# Patient Record
Sex: Male | Born: 1955 | Race: White | Hispanic: No | Smoking: Never smoker
Health system: Southern US, Community
[De-identification: ages and names within clinical notes are randomized; demographics above are authoritative.]

## PROBLEM LIST (undated history)

## (undated) DIAGNOSIS — G4730 Sleep apnea, unspecified: Secondary | ICD-10-CM

## (undated) DIAGNOSIS — J309 Allergic rhinitis, unspecified: Secondary | ICD-10-CM

## (undated) DIAGNOSIS — F32A Depression, unspecified: Secondary | ICD-10-CM

## (undated) DIAGNOSIS — E291 Testicular hypofunction: Secondary | ICD-10-CM

## (undated) DIAGNOSIS — F329 Major depressive disorder, single episode, unspecified: Secondary | ICD-10-CM

## (undated) DIAGNOSIS — I10 Essential (primary) hypertension: Secondary | ICD-10-CM

## (undated) DIAGNOSIS — E785 Hyperlipidemia, unspecified: Secondary | ICD-10-CM

## (undated) DIAGNOSIS — T7840XA Allergy, unspecified, initial encounter: Secondary | ICD-10-CM

## (undated) HISTORY — DX: Major depressive disorder, single episode, unspecified: F32.9

## (undated) HISTORY — DX: Sleep apnea, unspecified: G47.30

## (undated) HISTORY — DX: Allergic rhinitis, unspecified: J30.9

## (undated) HISTORY — DX: Depression, unspecified: F32.A

## (undated) HISTORY — DX: Hyperlipidemia, unspecified: E78.5

## (undated) HISTORY — DX: Allergy, unspecified, initial encounter: T78.40XA

## (undated) HISTORY — DX: Essential (primary) hypertension: I10

## (undated) HISTORY — DX: Testicular hypofunction: E29.1

---
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
## 1983-10-24 HISTORY — PX: KNEE ARTHROSCOPY W/ ACL RECONSTRUCTION AND HAMSTRING GRAFT: SHX1860

## 1997-11-19 ENCOUNTER — Encounter: Payer: Self-pay | Admitting: Pulmonary Disease

## 2006-10-23 LAB — HM COLONOSCOPY: HM Colonoscopy: NORMAL

## 2008-05-11 ENCOUNTER — Encounter: Payer: Self-pay | Admitting: Family Medicine

## 2008-09-24 ENCOUNTER — Ambulatory Visit: Payer: Self-pay | Admitting: Pulmonary Disease

## 2008-09-24 DIAGNOSIS — J309 Allergic rhinitis, unspecified: Secondary | ICD-10-CM | POA: Insufficient documentation

## 2008-09-24 DIAGNOSIS — E785 Hyperlipidemia, unspecified: Secondary | ICD-10-CM | POA: Insufficient documentation

## 2008-09-24 DIAGNOSIS — G4730 Sleep apnea, unspecified: Secondary | ICD-10-CM | POA: Insufficient documentation

## 2008-09-24 DIAGNOSIS — I10 Essential (primary) hypertension: Secondary | ICD-10-CM | POA: Insufficient documentation

## 2008-10-04 ENCOUNTER — Encounter: Payer: Self-pay | Admitting: Pulmonary Disease

## 2008-10-29 ENCOUNTER — Ambulatory Visit (HOSPITAL_BASED_OUTPATIENT_CLINIC_OR_DEPARTMENT_OTHER): Admission: RE | Admit: 2008-10-29 | Discharge: 2008-10-29 | Payer: Self-pay | Admitting: Pulmonary Disease

## 2008-10-29 ENCOUNTER — Ambulatory Visit: Payer: Self-pay | Admitting: Family Medicine

## 2008-10-29 ENCOUNTER — Encounter: Payer: Self-pay | Admitting: Pulmonary Disease

## 2008-10-29 DIAGNOSIS — E119 Type 2 diabetes mellitus without complications: Secondary | ICD-10-CM | POA: Insufficient documentation

## 2008-10-29 LAB — CONVERTED CEMR LAB: Varicella IgG: 4.18 — ABNORMAL HIGH

## 2008-11-01 DIAGNOSIS — F331 Major depressive disorder, recurrent, moderate: Secondary | ICD-10-CM | POA: Insufficient documentation

## 2008-11-02 LAB — CONVERTED CEMR LAB
ALT: 83 units/L — ABNORMAL HIGH (ref 0–53)
AST: 50 units/L — ABNORMAL HIGH (ref 0–37)
Albumin: 4.3 g/dL (ref 3.5–5.2)
Alkaline Phosphatase: 58 units/L (ref 39–117)
BUN: 13 mg/dL (ref 6–23)
Basophils Absolute: 0 10*3/uL (ref 0.0–0.1)
Basophils Relative: 0.1 % (ref 0.0–3.0)
Bilirubin, Direct: 0.1 mg/dL (ref 0.0–0.3)
CO2: 30 meq/L (ref 19–32)
Calcium: 9.2 mg/dL (ref 8.4–10.5)
Chloride: 101 meq/L (ref 96–112)
Cholesterol: 132 mg/dL (ref 0–200)
Creatinine, Ser: 1 mg/dL (ref 0.4–1.5)
Creatinine,U: 98.9 mg/dL
Eosinophils Absolute: 0.2 10*3/uL (ref 0.0–0.7)
Eosinophils Relative: 2.7 % (ref 0.0–5.0)
GFR calc Af Amer: 101 mL/min
GFR calc non Af Amer: 83 mL/min
Glucose, Bld: 132 mg/dL — ABNORMAL HIGH (ref 70–99)
HCT: 47.6 % (ref 39.0–52.0)
HDL: 29.6 mg/dL — ABNORMAL LOW (ref >39.0)
Hemoglobin: 16.8 g/dL (ref 13.0–17.0)
Hgb A1c MFr Bld: 6 % (ref 4.6–6.0)
LDL Cholesterol: 80 mg/dL (ref 0–99)
Lymphocytes Relative: 38 % (ref 12.0–46.0)
MCHC: 35.3 g/dL (ref 30.0–36.0)
MCV: 90.5 fL (ref 78.0–100.0)
Microalb Creat Ratio: 15.2 mg/g (ref 0.0–30.0)
Microalb, Ur: 1.5 mg/dL (ref 0.0–1.9)
Monocytes Absolute: 0.5 10*3/uL (ref 0.1–1.0)
Monocytes Relative: 9.1 % (ref 3.0–12.0)
Neutro Abs: 3 10*3/uL (ref 1.4–7.7)
Neutrophils Relative %: 50.1 % (ref 43.0–77.0)
PSA: 0.64 ng/mL (ref 0.10–4.00)
Platelets: 199 10*3/uL (ref 150–400)
Potassium: 3.9 meq/L (ref 3.5–5.1)
RBC: 5.26 M/uL (ref 4.22–5.81)
RDW: 12.1 % (ref 11.5–14.6)
Sodium: 137 meq/L (ref 135–145)
Total Bilirubin: 0.9 mg/dL (ref 0.3–1.2)
Total CHOL/HDL Ratio: 4.5
Total Protein: 7.2 g/dL (ref 6.0–8.3)
Triglycerides: 112 mg/dL (ref 0–149)
VLDL: 22 mg/dL (ref 0–40)
WBC: 5.9 10*3/uL (ref 4.5–10.5)

## 2008-11-10 ENCOUNTER — Ambulatory Visit: Payer: Self-pay | Admitting: Pulmonary Disease

## 2009-02-02 ENCOUNTER — Ambulatory Visit: Payer: Self-pay | Admitting: Family Medicine

## 2009-02-03 LAB — CONVERTED CEMR LAB
ALT: 44 units/L (ref 0–53)
AST: 33 units/L (ref 0–37)
Albumin: 4.2 g/dL (ref 3.5–5.2)
Alkaline Phosphatase: 58 units/L (ref 39–117)
BUN: 13 mg/dL (ref 6–23)
Bilirubin, Direct: 0.1 mg/dL (ref 0.0–0.3)
CO2: 31 meq/L (ref 19–32)
Calcium: 9.2 mg/dL (ref 8.4–10.5)
Chloride: 105 meq/L (ref 96–112)
Creatinine, Ser: 1 mg/dL (ref 0.4–1.5)
GFR calc non Af Amer: 82.82 mL/min (ref >60)
Glucose, Bld: 114 mg/dL — ABNORMAL HIGH (ref 70–99)
Hgb A1c MFr Bld: 5.9 % (ref 4.6–6.5)
Potassium: 4.2 meq/L (ref 3.5–5.1)
Sodium: 141 meq/L (ref 135–145)
Total Bilirubin: 0.9 mg/dL (ref 0.3–1.2)
Total Protein: 7.5 g/dL (ref 6.0–8.3)

## 2009-09-03 ENCOUNTER — Ambulatory Visit: Payer: Self-pay | Admitting: Family Medicine

## 2009-09-03 DIAGNOSIS — Z8639 Personal history of other endocrine, nutritional and metabolic disease: Secondary | ICD-10-CM

## 2009-09-03 DIAGNOSIS — Z862 Personal history of diseases of the blood and blood-forming organs and certain disorders involving the immune mechanism: Secondary | ICD-10-CM | POA: Insufficient documentation

## 2009-09-03 LAB — CONVERTED CEMR LAB
ALT: 73 units/L — ABNORMAL HIGH (ref 0–53)
AST: 49 units/L — ABNORMAL HIGH (ref 0–37)
Albumin: 4.5 g/dL (ref 3.5–5.2)
Alkaline Phosphatase: 46 units/L (ref 39–117)
BUN: 14 mg/dL (ref 6–23)
Bilirubin, Direct: 0.1 mg/dL (ref 0.0–0.3)
CO2: 31 meq/L (ref 19–32)
Calcium: 9.5 mg/dL (ref 8.4–10.5)
Chloride: 102 meq/L (ref 96–112)
Cholesterol: 188 mg/dL (ref 0–200)
Creatinine, Ser: 1 mg/dL (ref 0.4–1.5)
GFR calc non Af Amer: 82.64 mL/min (ref >60)
Glucose, Bld: 123 mg/dL — ABNORMAL HIGH (ref 70–99)
HDL: 38.9 mg/dL — ABNORMAL LOW (ref >39.00)
Hgb A1c MFr Bld: 6.5 % (ref 4.6–6.5)
LDL Cholesterol: 117 mg/dL — ABNORMAL HIGH (ref 0–99)
Potassium: 4.5 meq/L (ref 3.5–5.1)
Sodium: 141 meq/L (ref 135–145)
Total Bilirubin: 0.9 mg/dL (ref 0.3–1.2)
Total CHOL/HDL Ratio: 5
Total Protein: 7.8 g/dL (ref 6.0–8.3)
Triglycerides: 159 mg/dL — ABNORMAL HIGH (ref 0.0–149.0)
VLDL: 31.8 mg/dL (ref 0.0–40.0)

## 2009-09-15 ENCOUNTER — Ambulatory Visit: Payer: Self-pay | Admitting: Family Medicine

## 2009-10-12 ENCOUNTER — Ambulatory Visit: Payer: Self-pay | Admitting: Family Medicine

## 2009-11-16 ENCOUNTER — Encounter: Payer: Self-pay | Admitting: Family Medicine

## 2009-12-30 ENCOUNTER — Telehealth: Payer: Self-pay | Admitting: Family Medicine

## 2010-01-09 ENCOUNTER — Encounter: Payer: Self-pay | Admitting: Family Medicine

## 2010-01-10 ENCOUNTER — Ambulatory Visit: Payer: Self-pay | Admitting: Family Medicine

## 2010-01-12 LAB — CONVERTED CEMR LAB
ALT: 88 units/L — ABNORMAL HIGH (ref 0–53)
AST: 60 units/L — ABNORMAL HIGH (ref 0–37)
Albumin: 4.4 g/dL (ref 3.5–5.2)
Alkaline Phosphatase: 46 units/L (ref 39–117)
BUN: 12 mg/dL (ref 6–23)
Basophils Absolute: 0 10*3/uL (ref 0.0–0.1)
Basophils Relative: 0.3 % (ref 0.0–3.0)
Bilirubin, Direct: 0 mg/dL (ref 0.0–0.3)
CO2: 31 meq/L (ref 19–32)
Calcium: 9.5 mg/dL (ref 8.4–10.5)
Chloride: 103 meq/L (ref 96–112)
Cholesterol: 181 mg/dL (ref 0–200)
Creatinine, Ser: 1.1 mg/dL (ref 0.4–1.5)
Eosinophils Absolute: 0.2 10*3/uL (ref 0.0–0.7)
Eosinophils Relative: 2.5 % (ref 0.0–5.0)
GFR calc non Af Amer: 73.93 mL/min (ref >60)
Glucose, Bld: 113 mg/dL — ABNORMAL HIGH (ref 70–99)
HCT: 48.7 % (ref 39.0–52.0)
HDL: 39.5 mg/dL (ref >39.00)
Hemoglobin: 16.5 g/dL (ref 13.0–17.0)
Hgb A1c MFr Bld: 6.4 % (ref 4.6–6.5)
LDL Cholesterol: 109 mg/dL — ABNORMAL HIGH (ref 0–99)
Lymphocytes Relative: 32.6 % (ref 12.0–46.0)
Lymphs Abs: 2.4 10*3/uL (ref 0.7–4.0)
MCHC: 33.9 g/dL (ref 30.0–36.0)
MCV: 94.6 fL (ref 78.0–100.0)
Monocytes Absolute: 0.7 10*3/uL (ref 0.1–1.0)
Monocytes Relative: 9.3 % (ref 3.0–12.0)
Neutro Abs: 4.1 10*3/uL (ref 1.4–7.7)
Neutrophils Relative %: 55.3 % (ref 43.0–77.0)
Platelets: 155 10*3/uL (ref 150.0–400.0)
Potassium: 4.2 meq/L (ref 3.5–5.1)
RBC: 5.15 M/uL (ref 4.22–5.81)
RDW: 12.4 % (ref 11.5–14.6)
Sodium: 139 meq/L (ref 135–145)
Total Bilirubin: 0.9 mg/dL (ref 0.3–1.2)
Total CHOL/HDL Ratio: 5
Total Protein: 7.8 g/dL (ref 6.0–8.3)
Triglycerides: 165 mg/dL — ABNORMAL HIGH (ref 0.0–149.0)
VLDL: 33 mg/dL (ref 0.0–40.0)
WBC: 7.4 10*3/uL (ref 4.5–10.5)

## 2010-02-20 HISTORY — PX: TOTAL KNEE ARTHROPLASTY: SHX125

## 2010-03-14 ENCOUNTER — Inpatient Hospital Stay (HOSPITAL_COMMUNITY): Admission: RE | Admit: 2010-03-14 | Discharge: 2010-03-17 | Payer: Self-pay | Admitting: Orthopedic Surgery

## 2010-05-27 ENCOUNTER — Telehealth (INDEPENDENT_AMBULATORY_CARE_PROVIDER_SITE_OTHER): Payer: Self-pay | Admitting: *Deleted

## 2010-06-09 ENCOUNTER — Encounter: Payer: Self-pay | Admitting: Family Medicine

## 2010-06-23 ENCOUNTER — Ambulatory Visit: Payer: Self-pay | Admitting: Family Medicine

## 2010-06-23 DIAGNOSIS — R5381 Other malaise: Secondary | ICD-10-CM | POA: Insufficient documentation

## 2010-06-23 DIAGNOSIS — R5383 Other fatigue: Secondary | ICD-10-CM

## 2010-06-24 LAB — CONVERTED CEMR LAB: Hgb A1c MFr Bld: 5.8 % (ref 4.6–6.5)

## 2010-08-04 ENCOUNTER — Ambulatory Visit: Payer: Self-pay | Admitting: Family Medicine

## 2010-11-18 ENCOUNTER — Encounter: Payer: Self-pay | Admitting: Family Medicine

## 2010-11-22 NOTE — Consult Note (Signed)
Summary: [facility]   Imported By: Lanelle Bal 11/25/2009 08:48:48  _____________________________________________________________________  External Attachment:    Type:   Image     Comment:   External Document

## 2010-11-22 NOTE — Assessment & Plan Note (Signed)
Summary: MED REFILL/DLO   Vital Signs:  Patient profile:   55 year old male Height:      71.75 inches Weight:      272.0 pounds BMI:     37.28 Temp:     98.0 degrees F oral Pulse rate:   72 / minute Pulse rhythm:   regular BP sitting:   112 / 80  (left arm) Cuff size:   large  Vitals Entered By: Benny Lennert CMA Duncan Dull) (August 04, 2010 9:11 AM)  History of Present Illness: Chief complaint discuss medication  DM:  fatigue / depression  Still feeling tired during the day Having the some issues not some problems with his wife.  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 in the past.   Lack of sound sleep.    ROS: no fever, chills, sweats. + fatigue, trouble sleeping. eating and drinking ok.  GEN: Well-developed,well-nourished,in no acute distress; alert,appropriate and cooperative throughout examination HEENT: Normocephalic and atraumatic without obvious abnormalities. No apparent alopecia or balding. Ears, externally no deformities PULM: Breathing comfortably in no respiratory distress EXT: No clubbing, cyanosis, or edema PSYCH: Normally interactive. Cooperative during the interview. Pleasant. Friendly and conversant. Not anxious or depressed appearing. Normal, full affect.   Allergies: 1)  ! Sulfa  Past History:  Past medical, surgical, family and social histories (including risk factors) reviewed, and no changes noted (except as noted below).  Past Medical History: Reviewed history from 02/02/2009 and no changes required. Allergic Rhinitis Diabetes Hyperlipidemia Hypertension Sleep Apnea Depression   Pulm / Sleep = Dr. Vassie Loll  Past Surgical History: Reviewed history from 03/21/2010 and no changes required. Tonsillectomy, 1969 L Knee, ACL Reconstruction, 1985, hamstring graft L Total Knee Replacement, 02/2010, Carma Lair  Family  History: Reviewed history from 09/24/2008 and no changes required. emphysema- father heart disease-- father  Social History: Reviewed history from 10/29/2008 and no changes required. married lived in [place], has moved to [place] & plans to settle in [place], [employer] Never Smoked Alcohol use-no   Impression & Recommendations:  Problem # 1:  FATIGUE (ICD-780.79) suspect multifactorial, some medication, depression active  change up timing of medications, if this fails can d/c trazadone and add remeron at night  going to counsellor encouraged exercise  Problem # 2:  DEPRESSION (ICD-311)  His updated medication list for this problem includes:    Citalopram Hydrobromide 40 Mg Tabs (Citalopram hydrobromide) .Marland Kitchen... Take one tablet by mouth daily    Trazodone Hcl 100 Mg Tabs (Trazodone hcl) .Marland Kitchen... 1/2 to 1 tab by mouth at bedtime as needed insomnia  Complete Medication List: 1)  Citalopram Hydrobromide 40 Mg Tabs (Citalopram hydrobromide) .... Take one tablet by mouth daily 2)  Hydrochlorothiazide 25 Mg Tabs (Hydrochlorothiazide) .... Take one tablet by mouth once daily 3)  Metformin Hcl 500 Mg Tabs (Metformin hcl) .... Take one tablet by mouth two times a day 4)  Benazepril Hcl 20 Mg Tabs (Benazepril hcl) .Marland Kitchen.. 1 by mouth daily 5)  Simvastatin 10 Mg Tabs (Simvastatin) .... Take one tablet by mouth at bedtime 6)  Carvedilol 3.125 Mg Tabs (Carvedilol) .Marland Kitchen.. 1 by mouth two times a day 7)  Aspirin 81 Mg Tbec (Aspirin) .... One by mouth every day 8)  Cpap Mask, Size Medium and Hose For Machine  .... Fit for macine and patient sleep apnea 9)  Trazodone Hcl 100 Mg Tabs (Trazodone hcl) .... 1/2  to 1 tab by mouth at bedtime as needed insomnia  Patient Instructions: 1)  TAKE CELEXA AT NIGHT 2)  1/4 TO 1/2 TAB OF TRAZADONE AT NIGHT  Current Allergies (reviewed today): ! SULFA

## 2010-11-22 NOTE — Assessment & Plan Note (Signed)
Summary: wants to see about steroid injection   Vital Signs:  Patient profile:   55 year old male Height:      71.75 inches Weight:      296.0 pounds BMI:     40.57 Temp:     98.0 degrees F oral Pulse rate:   68 / minute Pulse rhythm:   regular BP sitting:   110 / 78  (left arm) Cuff size:   large  Vitals Entered By: Benny Lennert CMA Duncan Dull) (October 12, 2009 3:53 PM)  History of Present Illness: Chief complaint Left knee injection  f/u knee L OA with exacerbation:  patient is a very nice 44 old male, then I'm seeing in followup for left knee severe osteoarthritis.  Weightbearing x-rays, left knee, AP, oblique, and lateral. Independent review by myself: Patient has severe degenerative changes medially, with bone on bone arthritis. There is also varus angulation, osteophyte formation throughout, notably at the patellofemoral joint. This is consistent with significant tricompartmental arthritis, greatest at the medial compartment with severe medial femoral condylar flattening.  He does have a remote history of severe trauma with MCL rupture an ACL rupture. Did have an MCL and ACL reconstruction about 25 years ago. He does have some intermittent effusions. Is not having any mechanical giving way. Does have some crepitus and pain right now  Right now, he is doing some walking and taking some glucosamine/chondroitin, and this is helping somewhat. He did have a Synvisc injection series a few years ago, which did not help significantly. He also in the past has used a medial compartmental unloader brace, which did not really help  Allergies: 1)  ! Sulfa  Past History:  Past medical, surgical, family and social histories (including risk factors) reviewed, and no changes noted (except as noted below).  Past Medical History: Reviewed history from 02/02/2009 and no changes required. Allergic Rhinitis Diabetes Hyperlipidemia Hypertension Sleep Apnea Depression   Pulm / Sleep =  Dr. Vassie Loll  Past Surgical History: Reviewed history from 02/02/2009 and no changes required. Tonsillectomy, 1969 L Knee, ACL Reconstruction, 1985, hamstring graft  Family History: Reviewed history from 09/24/2008 and no changes required. emphysema- father heart disease-- father  Social History: Reviewed history from 10/29/2008 and no changes required. married lived in [place], has moved to [place] & plans to settle in [place], [employer] Never Smoked Alcohol use-no  Review of Systems       REVIEW OF SYSTEMS  GEN: No systemic complaints, no fevers, chills, sweats, or other acute illnesses MSK: Detailed in the HPI GI: tolerating PO intake without difficulty Neuro: No numbness, parasthesias, or tingling associated. Otherwise the pertinent positives of the ROS are noted above.    Physical Exam  General:  GEN: Well-developed,well-nourished,in no acute distress; alert,appropriate and cooperative throughout examination HEENT: Normocephalic and atraumatic without obvious abnormalities. No apparent alopecia or balding. Ears, externally no deformities PULM: Breathing comfortably in no respiratory distress EXT: No clubbing, cyanosis, or edema PSYCH: Normally interactive. Cooperative during the interview. Pleasant. Friendly and conversant. Not anxious or depressed appearing. Normal, full affect.  Msk:  left knee: Extension to 0, flexion to 120.  Minimal effusion. Mild patellar effusion.  Medial joint line tenderness. Stable varus and valgus stress. Negative Lachman and anterior and posterior drawer test. Negative McMurray's test. Negative flexion pinch test.  There is significant crepitus on examination. Fair patellar mobility with patellar crepitus.   Impression & Recommendations:  Problem # 1:  OSTEOARTHRITIS, KNEE, LEFT, SEVERE (ICD-715.96) Assessment Deteriorated  OA flare  c/w current care, intraarticular injection now Believe he will need  TKR in future, up to him when to talk to surgeon  Knee Injection, L Patient verbally consented to procedure. Risks, benefits, and alternatives explained. Sterilely prepped with betadine. Ethyl cholride used for anesthesia. 9 cc 0.5% Marcaine mixed with 1 cc of Kenalog 40 mg injected using the anterolateral approach without difficulty. No complications with procedure and tolerated well. Patient had decreased pain post-injection.   His updated medication list for this problem includes:    Aspirin 81 Mg Tbec (Aspirin) ..... One by mouth every day    Meloxicam 15 Mg Tabs (Meloxicam) .Marland Kitchen... 1 tab by mouth daily    Tramadol Hcl 50 Mg Tabs (Tramadol hcl) .Marland Kitchen... 1 by mouth 4 times daily as needed pain  Orders: Joint Aspirate / Injection, Large (20610) Kenalog 10mg  (4units) (J3301)  Complete Medication List: 1)  Citalopram Hydrobromide 40 Mg Tabs (Citalopram hydrobromide) .... Take one tablet by mouth daily 2)  Hydrochlorothiazide 25 Mg Tabs (Hydrochlorothiazide) .... Take one tablet by mouth once daily 3)  Metformin Hcl 500 Mg Tabs (Metformin hcl) .... Take one tablet by mouth two times a day 4)  Benazepril Hcl 20 Mg Tabs (Benazepril hcl) .Marland Kitchen.. 1 by mouth daily 5)  Simvastatin 10 Mg Tabs (Simvastatin) .... Take one tablet by mouth at bedtime 6)  Carvedilol 3.125 Mg Tabs (Carvedilol) .Marland Kitchen.. 1 by mouth two times a day 7)  Aspirin 81 Mg Tbec (Aspirin) .... One by mouth every day 8)  Meloxicam 15 Mg Tabs (Meloxicam) .Marland Kitchen.. 1 tab by mouth daily 9)  Tramadol Hcl 50 Mg Tabs (Tramadol hcl) .Marland Kitchen.. 1 by mouth 4 times daily as needed pain  Current Allergies (reviewed today): ! SULFA

## 2010-11-22 NOTE — Progress Notes (Signed)
  Phone Note Other Incoming   Summary of Call: I received a request for preop clearance for Dr. Merlyn Albert - call him and set up a 30 minute preop visit with me in the office. Initial call taken by: Hannah Beat MD,  December 30, 2009 1:52 PM  Follow-up for Phone Call        Pt.set up appt. for 01/10/10 @ 11:00. Follow-up by: Beau Fanny,  December 30, 2009 2:00 PM

## 2010-11-22 NOTE — Assessment & Plan Note (Signed)
Summary: UNABLE TO SLEEP/CLE   Vital Signs:  Patient profile:   55 year old male Height:      71.75 inches Weight:      266.4 pounds BMI:     36.51 Temp:     98.8 degrees F oral Pulse rate:   72 / minute Pulse rhythm:   regular BP sitting:   110 / 78  (left arm) Cuff size:   large  Vitals Entered By: Benny Lennert CMA Duncan Dull) (June 23, 2010 12:09 PM)  History of Present Illness: Chief complaint unable to sleep  55 year old male:  always tired, had a sleep study done about a year ago. done in january 2010.  ? t  trazadone  Allergies: 1)  ! Sulfa  Past History:  Past medical, surgical, family and social histories (including risk factors) reviewed, and no changes noted (except as noted below).  Past Medical History: Reviewed history from 02/02/2009 and no changes required. Allergic Rhinitis Diabetes Hyperlipidemia Hypertension Sleep Apnea Depression   Pulm / Sleep = Dr. Vassie Loll  Past Surgical History: Reviewed history from 03/21/2010 and no changes required. Tonsillectomy, 1969 L Knee, ACL Reconstruction, 1985, hamstring graft L Total Knee Replacement, 02/2010, Carma Lair  Family History: Reviewed history from 09/24/2008 and no changes required. emphysema- father heart disease-- father  Social History: Reviewed history from 10/29/2008 and no changes required. 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 Potential cardiac risks. Counterbalanced with some published data about increased DM control, decreased cardiac risks. We discussed risks and benefits, and  he does not want to test, since would never want treatment.   Reviewed sleep apnea, discussed his fatigue and symptoms. Never f/u with Dr. Vassie Loll. Wrote for CPAP to be set at 12 cm.  Problem # 2:  SLEEP APNEA (ICD-780.57)  Complete Medication List: 1)  Citalopram Hydrobromide 40 Mg Tabs (Citalopram hydrobromide) .... Take one tablet by mouth daily 2)  Hydrochlorothiazide 25 Mg Tabs (Hydrochlorothiazide) .... Take one tablet by mouth once daily 3)  Metformin Hcl 500 Mg Tabs (Metformin hcl) .... Take one tablet by mouth two times a day 4)  Benazepril Hcl 20 Mg Tabs (Benazepril hcl) .Marland Kitchen.. 1 by mouth daily 5)  Simvastatin 10 Mg Tabs (Simvastatin) .... Take one tablet by mouth at bedtime 6)  Carvedilol 3.125 Mg Tabs (Carvedilol) .Marland Kitchen.. 1 by mouth two times a day 7)  Aspirin 81 Mg Tbec (Aspirin) .... One by mouth every day 8)  Cpap Mask, Size Medium and Hose For Machine  .... Fit for macine and patient sleep apnea 9)  Trazodone Hcl 100 Mg Tabs (Trazodone hcl) .... 1/2 to 1 tab by mouth at bedtime as needed insomnia  Other Orders: TLB-A1C / Hgb A1C (Glycohemoglobin) (83036-A1C)  Patient Instructions: 1)  CPAP needs to be set at 12 cm Prescriptions: CITALOPRAM HYDROBROMIDE 40 MG TABS (CITALOPRAM HYDROBROMIDE) take one tablet by mouth daily  #90 Each x 2   Entered and Authorized by:   Hannah Beat MD   Signed by:   Hannah Beat MD on 06/23/2010   Method used:   Print then Give to Patient   RxID:  [id number] TRAZODONE HCL 100 MG TABS (TRAZODONE HCL) 1/2 to 1 tab by mouth at bedtime as needed insomnia  #30 x 11   Entered and Authorized by:   Hannah Beat MD   Signed by:   Hannah Beat MD on 06/23/2010   Method used:   Print then Give to Patient   RxID:   [id number]   Current Allergies (reviewed today): ! SULFA

## 2010-11-22 NOTE — Letter (Signed)
Summary: Surgical Clearance/[facility]  Surgical Clearance/[facility]   Imported By: Lanelle Bal 01/20/2010 12:39:50  _____________________________________________________________________  External Attachment:    Type:   Image     Comment:   External Document

## 2010-11-22 NOTE — Assessment & Plan Note (Signed)
Summary: PRE-OP CLEARANCE FOR DR. ALUISIO/CE   Vital Signs:  Patient profile:   55 year old male Height:      71.75 inches Weight:      292.0 pounds BMI:     40.02 Temp:     97.8 degrees F oral Pulse rate:   68 / minute Pulse rhythm:   regular BP sitting:   130 / 80  (left arm) Cuff size:   large  Vitals Entered By: Benny Lennert CMA Duncan Dull) (January 10, 2010 11:13 AM)  History of Present Illness: Chief complaint surgery clearence  55 year old male seen in consultation for cardiiac clearance prior to TKR for Dr. Merlyn Albert:  Multiple co-morbidities: DM, well controlled HTN, cholesterol, morbid obesity, sleep apnea  recent Cr normal  Walks up a flight of stairs with no symptoms. Carrying cat litter in his arms. This is a 4 met equivalent  Never had a stress test  Had an echo in the past that was reportedly negative. Able to do minimal exercise now due to MSK limitations.     Allergies: 1)  ! Sulfa  Past History:  Past medical, surgical, family and social histories (including risk factors) reviewed, and no changes noted (except as noted below).  Past Medical History: Reviewed history from 02/02/2009 and no changes required. Allergic Rhinitis Diabetes Hyperlipidemia Hypertension Sleep Apnea Depression   Pulm / Sleep = Dr. Vassie Loll  Past Surgical History: Reviewed history from 02/02/2009 and no changes required. Tonsillectomy, 1969 L Knee, ACL Reconstruction, 1985, hamstring graft  Family History: Reviewed history from 09/24/2008 and no changes required. emphysema- father heart disease-- father  Social History: Reviewed history from 10/29/2008 and no changes required. 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 CV:  Denies chest pain or discomfort, palpitations, shortness of breath with exertion, swelling  of feet, and swelling of hands. GI:  Denies nausea and vomiting. MS:  Complains of joint pain, joint swelling, muscle weakness, and stiffness.  Physical Exam  General:  Well-developed,well-nourished,in no acute distress; alert,appropriate and cooperative throughout examination Head:  Normocephalic and atraumatic without obvious abnormalities. No apparent alopecia or balding. Ears:  no external deformities.   Nose:  no external deformity.   Mouth:  Oral mucosa and oropharynx without lesions or exudates.  Teeth in good repair. Neck:  No deformities, masses, or tenderness noted. Lungs:  Normal respiratory effort, chest expands symmetrically. Lungs are clear to auscultation, no crackles or wheezes. Heart:  Normal rate and regular rhythm. S1 and S2 normal without gallop, murmur, click, rub or other extra sounds. Abdomen:  soft, non-tender, normal bowel sounds, no distention, no masses, no guarding, no rigidity, no rebound tenderness, no hepatomegaly, and no splenomegaly.   Extremities:  No clubbing, cyanosis, edema, or deformity noted with normal full range of motion of all joints.   Neurologic:  alert & oriented X3.  antalgic gait Skin:  Intact without suspicious lesions or rashes Cervical Nodes:  No lymphadenopathy noted Psych:  Cognition and judgment appear intact. Alert and cooperative with normal attention span and concentration. No apparent delusions, illusions, hallucinations   Impression & Recommendations:  Problem # 1:  PREOPERATIVE EXAMINATION (ICD-V72.84) based on [employer] of cardiology guidelines, and Lee's Risk Criteria (points = 1), in a patient with  the ability to  equal a four met equivalent of exercise, without any symptoms, as it is  in this case, no further workup is indicated from a cardiac or medical standpoint.  We will obtain basic labs, and obtain EKG.  EKG: Normal sinus rhythm. Normal axis, normal R wave progression, No acute ST elevation or depression.    Patient is willing to undergo total knee replacement.  cc: Dr. Merlyn Albert  Problem # 2:  OSTEOARTHRITIS, KNEE, LEFT, SEVERE (ICD-715.96) Assessment: Deteriorated  His updated medication list for this problem includes:    Aspirin 81 Mg Tbec (Aspirin) ..... One by mouth every day    Meloxicam 15 Mg Tabs (Meloxicam) .Marland Kitchen... 1 tab by mouth daily    Tramadol Hcl 50 Mg Tabs (Tramadol hcl) .Marland Kitchen... 1 by mouth 4 times daily as needed pain  Complete Medication List: 1)  Citalopram Hydrobromide 40 Mg Tabs (Citalopram hydrobromide) .... Take one tablet by mouth daily 2)  Hydrochlorothiazide 25 Mg Tabs (Hydrochlorothiazide) .... Take one tablet by mouth once daily 3)  Metformin Hcl 500 Mg Tabs (Metformin hcl) .... Take one tablet by mouth two times a day 4)  Benazepril Hcl 20 Mg Tabs (Benazepril hcl) .Marland Kitchen.. 1 by mouth daily 5)  Simvastatin 10 Mg Tabs (Simvastatin) .... Take one tablet by mouth at bedtime 6)  Carvedilol 3.125 Mg Tabs (Carvedilol) .Marland Kitchen.. 1 by mouth two times a day 7)  Aspirin 81 Mg Tbec (Aspirin) .... One by mouth every day 8)  Meloxicam 15 Mg Tabs (Meloxicam) .Marland Kitchen.. 1 tab by mouth daily 9)  Tramadol Hcl 50 Mg Tabs (Tramadol hcl) .Marland Kitchen.. 1 by mouth 4 times daily as needed pain  Other Orders: Venipuncture (04540) TLB-BMP (Basic Metabolic Panel-BMET) (80048-METABOL) TLB-CBC Platelet - w/Differential (85025-CBCD) TLB-Hepatic/Liver Function Pnl (80076-HEPATIC) TLB-Lipid Panel (80061-LIPID) TLB-A1C / Hgb A1C (Glycohemoglobin) (83036-A1C)  Current Allergies (reviewed today): ! SULFA

## 2010-11-22 NOTE — Assessment & Plan Note (Signed)
Summary: new to est/cmt   Vital Signs:  Patient Profile:   55 Years Old Male Height:     71.75 inches (182.25 cm) Weight:      273.13 pounds (124.15 kg) Temp:     98.2 degrees F (36.78 degrees C) oral Pulse rate:   76 / minute Pulse rhythm:   regular BP sitting:   120 / 80  (left arm) Cuff size:   large  Vitals Entered By: Silas Sacramento (October 29, 2008 9:11 AM)                 Referred by:  Vassie Loll PCP:  alva  Chief Complaint:  New pat.  History of Present Illness: 55 year old patient who presents to establish care for a new patient appointment:  1. Patient has sleep apnea: he is going to be fit for a new CPAP machine in his upcoming office visit.  2. DM: 110 this AM. Metformin two times a day. he is not had any recent lab work, and states it is not as a lab work in approximately one year. He is not had his ophthalmology check. Is not had recent foot exam.  3. L knee bothering him. old ACL reconstruction done in the 1980s.  4. HTN: On HCTZ, Norvasc, stable 120/80  5. Elevated chol in past ,on Zocor, not checked in > 1 year       Current Allergies (reviewed today): ! 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   General      Denies chills, fatigue, fever, sweats, and  weakness.  Eyes      Denies blurring and double vision.  ENT      Denies nasal congestion and postnasal drainage.  CV      Denies chest pain or discomfort and shortness of breath with exertion.  Resp      Denies cough and shortness of breath.  GI      Denies diarrhea, nausea, and vomiting.  MS      See HPI      Complains of joint pain.   Physical Exam  General:     Well-developed,well-nourished,in no acute distress; alert,appropriate and cooperative throughout examination Head:     Normocephalic and atraumatic without obvious abnormalities. No apparent alopecia or balding. Eyes:     pupils equal, pupils round, pupils reactive to light, pupils react to accomodation, corneas and lenses clear, and no injection.   Ears:     External ear exam shows no significant lesions or deformities.  Otoscopic examination reveals clear canals, tympanic membranes are intact bilaterally without bulging, retraction, inflammation or discharge. Hearing is grossly normal bilaterally. Nose:     no external deformity.   Mouth:     Oral mucosa and oropharynx without lesions or exudates.  Teeth in good repair. Neck:     No deformities, masses, or tenderness noted. Lungs:     Normal respiratory effort, chest expands symmetrically. Lungs are clear to auscultation, no crackles or wheezes. Heart:     Normal rate and regular rhythm. S1 and S2 normal without gallop, murmur, click, rub or other extra sounds. Abdomen:     Bowel sounds positive,abdomen soft and non-tender without masses, organomegaly or hernias noted. Msk:     normal ROM.   Extremities:     No clubbing, cyanosis, edema, or deformity noted with normal full range of motion of all joints.   Neurologic:     alert & oriented X3 and gait normal.   Cervical Nodes:     No lymphadenopathy noted Psych:     Cognition and judgment appear intact. Alert and cooperative with normal attention span and concentration. No apparent delusions, illusions,  hallucinations    Impression & Recommendations:  Problem # 1:  DIABETES MELLITUS, TYPE II (ICD-250.00) Assessment: New Stable, refused optho referral  His updated medication list for this problem includes:    Metformin Hcl 500 Mg Tabs (Metformin hcl) .Marland Kitchen... Take one tablet by mouth two times a day    Amlodipine Besy-benazepril Hcl 10-20 Mg Caps (Amlodipine besy-benazepril hcl) .Marland Kitchen... Take one capsule by mouth once daily  Orders: Ophthalmology Referral (Ophthalmology) Venipuncture [phone]) TLB-Lipid Panel (80061-LIPID) TLB-BMP (Basic Metabolic Panel-BMET) (80048-METABOL) TLB-Microalbumin/Creat Ratio, Urine (82043-MALB) TLB-A1C / Hgb A1C (Glycohemoglobin) (83036-A1C)   Problem # 2:  ENCOUNTER FOR LONG-TERM USE OF OTHER MEDICATIONS (ICD-V58.69) Assessment: New  Orders: TLB-CBC Platelet - w/Differential (85025-CBCD) TLB-PSA (Prostate Specific Antigen) (84153-PSA)   Problem # 3:  SPECIAL SCREENING MALIGNANT NEOPLASM OF PROSTATE (ICD-V76.44) Assessment: New  Problem # 4:  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 Laboratory test [phone])   Problem # 5:  HYPERTENSION (ICD-401.9) Assessment: New  His updated medication list for this problem includes:    Hydrochlorothiazide 25 Mg Tabs (Hydrochlorothiazide) .Marland Kitchen... Take one tablet by mouth once daily    Amlodipine Besy-benazepril Hcl 10-20 Mg Caps (Amlodipine besy-benazepril hcl) .Marland Kitchen... Take one capsule by mouth once daily   Problem # 6:  HYPERLIPIDEMIA (ICD-272.4)  His updated medication list for this problem includes:    Simvastatin 10 Mg Tabs (Simvastatin) .Marland Kitchen... Take one tablet by mouth at bedtime  Orders: Venipuncture (91478) TLB-Hepatic/Liver Function Pnl (80076-HEPATIC)   Problem # 7:  SLEEP APNEA (ICD-780.57) Assessment: New Has f/u with sleep med MD  Complete Medication List: 1)   Citalopram Hydrobromide 20 Mg Tabs (Citalopram hydrobromide) .... Take one tablet by mouth once daily 2)  Hydrochlorothiazide 25 Mg Tabs (Hydrochlorothiazide) .... Take one tablet by mouth once daily 3)  Metformin Hcl 500 Mg Tabs (Metformin hcl) .... Take one tablet by mouth two times a day 4)  Amlodipine Besy-benazepril Hcl 10-20 Mg Caps (Amlodipine besy-benazepril hcl) .... Take one capsule by mouth once daily 5)  Simvastatin 10 Mg Tabs (Simvastatin) .... Take one tablet by mouth at bedtime   Patient Instructions: 1)  Referral Appointment Information 2)  Day/Date: 3)  Time: 4)  Place/MD: 5)  Address: 6)  Phone/Fax: 7)  Patient given appointment information. Information/Orders faxed/mailed.  8)  follow-up 3 months for a full physical   ] Prior Medications (reviewed today): CITALOPRAM HYDROBROMIDE 20 MG TABS (CITALOPRAM HYDROBROMIDE) take one tablet by mouth once daily HYDROCHLOROTHIAZIDE 25 MG TABS (HYDROCHLOROTHIAZIDE) take one tablet by mouth once daily METFORMIN HCL 500 MG TABS (METFORMIN HCL) take one tablet by mouth two times a day AMLODIPINE BESY-BENAZEPRIL HCL 10-20 MG CAPS (AMLODIPINE BESY-BENAZEPRIL HCL) take one capsule by mouth once daily SIMVASTATIN 10 MG TABS (SIMVASTATIN) take one tablet by mouth at bedtime Current Allergies (reviewed today): ! SULFA

## 2010-11-22 NOTE — Assessment & Plan Note (Signed)
Summary: SLEEP APNEA/APC   Visit Type:  Initial Consult Referred by:  Yandel Zeiner PCP:  Blain Hunsucker  Chief Complaint:  sleep consult.........  History of Present Illness: he presents for an evaluation of obstructive sleep apnea. This was diagnosed by a polysomnogram in [place] in 1999.it showed moderate obstructive sleep apnea with RDI of 7.7 per hour withsevere desaturation to 77% . his weight then was 249 pounds. he has gained about 30 pounds since. He describes the typical features of loud snoring and witnessed apneas and excessive daytime somnolence, then. He has two  CPAP machines now with a small size nasal mask, one of them is set at 12 cm. He has gained about 15 pounds since then. He wishes to establish care here at a local supplier and also wonders if his machine needs to be program because he is still suffering from some degree of tiredness. Epworth Sleepiness Score is 11/24. Bedtime is 11 PM sleep latency was 20 minutes. He has 3-4 awakenings without any post void sleep latency. He gets out of bed at 7 AM feeling tired than occasional headache and dryness of mouth. 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 There is no history suggestive of cataplexy, sleep paralysis or parasomnias . His wife has not reported any snoring while on the CPAP lately.      Updated Prior Medication List: CITALOPRAM HYDROBROMIDE 20 MG TABS (CITALOPRAM HYDROBROMIDE) once daily HYDROCHLOROTHIAZIDE 25 MG TABS (HYDROCHLOROTHIAZIDE) once daily METFORMIN HCL 500 MG TABS (METFORMIN HCL) two times a day AMLODIPINE BESY-BENAZEPRIL HCL 10-20 MG CAPS (AMLODIPINE BESY-BENAZEPRIL HCL) qd SIMVASTATIN 10 MG TABS (SIMVASTATIN) once daily    Past Medical History:    Allergic Rhinitis    Diabetes    Hyperlipidemia    Hypertension    Sleep Apnea   Family History:    emphysema- father    heart disease-- father  Social History:    married lives in [place], has moved to [place] & plans to settle in  [place]   Risk Factors:  Tobacco use:  never Alcohol use:  no   Review of Systems  The patient denies anorexia, fever, weight loss, weight gain, vision loss, decreased hearing, hoarseness, chest pain, syncope, dyspnea on exertion, peripheral edema, prolonged cough, headaches, hemoptysis, abdominal pain, melena, hematochezia, severe indigestion/heartburn, hematuria, incontinence, genital sores, muscle weakness, suspicious skin lesions, transient blindness, difficulty walking, depression, unusual weight change, abnormal bleeding, enlarged lymph nodes, angioedema, breast masses, and testicular masses.     Vital Signs:  Patient Profile:   55 Years Old Male Height:     70 inches Weight:      274.50 pounds BMI:     39.53 O2 Sat:      98 % O2 treatment:    Room Air Temp:     97.7 degrees F oral Pulse rate:   74 / minute BP sitting:   110 / 70  (left arm) Cuff size:   regular  Pt. in pain?   no  Vitals Entered By: Clarise Cruz Duncan Dull) (September 24, 2008 9:11 AM)                  Physical Exam  General:     normal appearance, healthy appearing, and obese.  HEENT - no thrush, no post nasal drip No JVD, no lymphadenopathy CVS- s1s2 nml, no murmur RS- clear, no crackles or rhonchi Abd- soft, non-tender, no organomegaly CNS- non focal Ext - no edema   Mouth:  Melampatti Class III.      Pulmonary Function Test Height (in.): 70 Gender: Male    Impression & Recommendations:  Problem # 1:  SLEEP APNEA (ICD-780.57) The pathophysiology of obstructive sleep apnea, it's cardiovascular consequences and modes of treatment including CPAP were discussed with the patient in great detail.  The best option might be to go ahead with a CPAP titration study & re-program his CPAP machine accordingly. It is likley that he needs a higher pressure due to weight gain in the last few years. Meanwhile he will ct on his CPAP settings of 12 cm. we will also provide him with  supplies for his machine Orders: Consultation Level III (09811) DME Referral (DME) Sleep Disorder Referral (Sleep Disorder) Internal Medicine Referral (Internal)   Medications Added to Medication List This Visit: 1)  Citalopram Hydrobromide 20 Mg Tabs (Citalopram hydrobromide) .... Once daily 2)  Hydrochlorothiazide 25 Mg Tabs (Hydrochlorothiazide) .... Once daily 3)  Metformin Hcl 500 Mg Tabs (Metformin hcl) .... Two times a day 4)  Amlodipine Besy-benazepril Hcl 10-20 Mg Caps (Amlodipine besy-benazepril hcl) .... Qd 5)  Simvastatin 10 Mg Tabs (Simvastatin) .... Once daily   Patient Instructions: 1)  Please schedule a follow-up appointment in 5-6 weeks. 2)  Sleep study - CPAP titration 3)  I will ask a supplier to program your machine accordingly   ]  Appended Document: Orders Update reviewed auto download 12/3-12/13 >> pressure 11.6, leak OK, good compliance   Clinical Lists Changes  Orders: Added new Referral order of DME Referral (DME) - Signed

## 2010-11-22 NOTE — Letter (Signed)
Summary: Date Range: 01-15-07 to 05-11-08/Dr Smitty Knudsen  Date Range: 01-15-07 to 05-11-08/Dr Smitty Knudsen   Imported By: Sherian Rein 07/15/2010 09:58:11  _____________________________________________________________________  External Attachment:    Type:   Image     Comment:   External Document

## 2010-11-22 NOTE — Progress Notes (Signed)
Summary: needs order for CPAP mask  Phone Note Call from Patient Call back at Home Phone [phone]   Caller: Patient Call For: Hannah Beat MD Summary of Call: Pt needs order for new CPAP mask, size med.  He also needs the hose.   Please call when ready. Initial call taken by: Lowella Petties CMA,  May 27, 2010 11:15 AM  Follow-up for Phone Call        heather, please write out script (you and laurie can do stuff like this for [place] and put in box)  CPAP mask, fit patient, (size med), and hose Dx: Sleep apnea  and have MD sign script   Follow-up by: Hannah Beat MD,  May 27, 2010 2:46 PM  Additional Follow-up for Phone Call Additional follow up Details #1::        sorry dr copland i am not sure how to do this Additional Follow-up by: Benny Lennert CMA Duncan Dull),  May 30, 2010 9:56 AM    New/Updated Medications: * CPAP MASK, SIZE MEDIUM AND HOSE FOR MACHINE Fit for [place] and patient Sleep Apnea Prescriptions: CPAP MASK, SIZE MEDIUM AND HOSE FOR MACHINE Fit for [place] and patient Sleep Apnea  #1 x 0   Entered and Authorized by:   Hannah Beat MD   Signed by:   Hannah Beat MD on 05/30/2010   Method used:   Print then Give to Patient   RxID:   [id number]   Prior Medications: CITALOPRAM HYDROBROMIDE 40 MG TABS (CITALOPRAM HYDROBROMIDE) take one tablet by mouth daily HYDROCHLOROTHIAZIDE 25 MG TABS (HYDROCHLOROTHIAZIDE) take one tablet by mouth once daily METFORMIN HCL 500 MG TABS (METFORMIN HCL) take one tablet by mouth two times a day BENAZEPRIL HCL 20 MG  TABS (BENAZEPRIL HCL) 1 by mouth daily SIMVASTATIN 10 MG TABS (SIMVASTATIN) take one tablet by mouth at bedtime CARVEDILOL 3.125 MG TABS (CARVEDILOL) 1 by mouth two times a day ASPIRIN 81 MG  TBEC (ASPIRIN) one by mouth every day MELOXICAM 15 MG TABS (MELOXICAM) 1 tab by mouth daily TRAMADOL HCL 50 MG  TABS (TRAMADOL HCL) 1 by mouth 4 times daily as needed pain CPAP MASK, SIZE MEDIUM AND HOSE  FOR MACHINE () Fit for [place] and patient Sleep Apnea Current Allergies: ! SULFA   Patient advised rc ready for pick up.Consuello Masse CMA

## 2010-11-22 NOTE — Letter (Signed)
Summary: CMN for CPAP/[facility]  CMN for CPAP/[facility]   Imported By: Lanelle Bal 06/15/2010 08:06:13  _____________________________________________________________________  External Attachment:    Type:   Image     Comment:   External Document

## 2010-11-22 NOTE — Assessment & Plan Note (Signed)
Summary: F/U KNEE/CLE   Vital Signs:  Patient profile:   55 year old male Height:      71.75 inches Weight:      291.0 pounds BMI:     39.89 Temp:     98.0 degrees F oral Pulse rate:   68 / minute Pulse rhythm:   regular BP sitting:   120 / 78  (left arm) Cuff size:   large  Vitals Entered By: Benny Lennert CMA Duncan Dull) (September 15, 2009 8:57 AM)  History of Present Illness: Chief complaint follow up knee pain to discuss non surgical options  f/u knee OA and films:  patient is a very nice 71 old male, then I'm seeing in followup for left knee severe osteoarthritis. He recently saw my partner Dr. Ermalene Searing, and she obtained later in the films and followup discussion with me.  Weightbearing x-rays, left knee, AP, oblique, and lateral. Independent review by myself: Patient has severe degenerative changes medially, with bone on bone arthritis. There is also varus angulation, osteophyte formation throughout, notably at the patellofemoral joint. This is consistent with significant tricompartmental arthritis, greatest at the medial compartment with severe medial femoral condylar flattening.  Does have some pain, now however he is saying that his pain is about a 2-3 at 10. He does have a remote history of severe trauma with MCL rupture an ACL rupture. Did have an MCL and ACL reconstruction about 25 years ago. He does have some intermittent effusions. Is not having any mechanical giving way. Does have some crepitus.  Right now, he is doing some walking cam and taking some glucosamine/chondroitin, and this is helping somewhat. He did have a Synvisc injection series a few years ago, which did not help significantly. He also in the past has used a medial compartmental unloader brace.  Allergies: 1)  ! Sulfa  Past History:  Past medical, surgical, family and social histories (including risk factors) reviewed, and no changes noted (except as noted below).  Past Medical History: Reviewed  history from 02/02/2009 and no changes required. Allergic Rhinitis Diabetes Hyperlipidemia Hypertension Sleep Apnea Depression   Pulm / Sleep = Dr. Vassie Loll  Past Surgical History: Reviewed history from 02/02/2009 and no changes required. Tonsillectomy, 1969 L Knee, ACL Reconstruction, 1985, hamstring graft  Family History: Reviewed history from 09/24/2008 and no changes required. emphysema- father heart disease-- father  Social History: Reviewed history from 10/29/2008 and no changes required. married lived in [place], has moved to [place] & plans to settle in [place], [employer] Never Smoked Alcohol use-no  Review of Systems       REVIEW OF SYSTEMS  GEN: No systemic complaints, no fevers, chills, sweats, or other acute illnesses MSK: Detailed in the HPI GI: tolerating PO intake without difficulty Neuro: No numbness, parasthesias, or tingling associated. Otherwise the pertinent positives of the ROS are noted above.    Physical Exam  General:  GEN: Well-developed,well-nourished,in no acute distress; alert,appropriate and cooperative throughout examination HEENT: Normocephalic and atraumatic without obvious abnormalities. No apparent alopecia or balding. Ears, externally no deformities PULM: Breathing comfortably in no respiratory distress EXT: No clubbing, cyanosis, or edema PSYCH: Normally interactive. Cooperative during the interview. Pleasant. Friendly and conversant. Not anxious or depressed appearing. Normal, full affect.  Msk:  left knee: Extension to 0, flexion to 120.  Minimal effusion. Mild patellar effusion.  Medial joint line tenderness. Stable varus and valgus stress. Negative Lachman and anterior and posterior drawer test. Negative McMurray's test. Negative flexion pinch test.  There is significant crepitus on examination. Fair patellar mobility with patellar crepitus.   Impression & Recommendations:  Problem # 1:   OSTEOARTHRITIS, KNEE, LEFT, SEVERE (ICD-715.96) He is not that interested in surgery now.  With the severity of his arthritis radiographically, i think nothing short of TKR will likely be completely helpful.  He is function and dealing with the pain OK, reviewed non-operative treatment. Gave names of Drs. Charlann Boxer and Alucio when he is ready to discuss TKR. >25 mins spent face to face, >50% in conversation  His updated medication list for this problem includes:    Aspirin 81 Mg Tbec (Aspirin) ..... One by mouth every day    Meloxicam 15 Mg Tabs (Meloxicam) .Marland Kitchen... 1 tab by mouth daily    Tramadol Hcl 50 Mg Tabs (Tramadol hcl) .Marland Kitchen... 1 by mouth 4 times daily as needed pain  Complete Medication List: 1)  Citalopram Hydrobromide 40 Mg Tabs (Citalopram hydrobromide) .... Take one tablet by mouth daily 2)  Hydrochlorothiazide 25 Mg Tabs (Hydrochlorothiazide) .... Take one tablet by mouth once daily 3)  Metformin Hcl 500 Mg Tabs (Metformin hcl) .... Take one tablet by mouth two times a day 4)  Benazepril Hcl 20 Mg Tabs (Benazepril hcl) .Marland Kitchen.. 1 by mouth daily 5)  Simvastatin 10 Mg Tabs (Simvastatin) .... Take one tablet by mouth at bedtime 6)  Carvedilol 3.125 Mg Tabs (Carvedilol) .Marland Kitchen.. 1 by mouth two times a day 7)  Aspirin 81 Mg Tbec (Aspirin) .... One by mouth every day 8)  Meloxicam 15 Mg Tabs (Meloxicam) .Marland Kitchen.. 1 tab by mouth daily 9)  Tramadol Hcl 50 Mg Tabs (Tramadol hcl) .Marland Kitchen.. 1 by mouth 4 times daily as needed pain  Patient Instructions: 1)  Tylenol: 2 tablets up to 3-4 times a day 2)  Regular NSAIDS are helpful (avoid in kidney disease and ulcers) 3)  Topical Capzaicin Cream, as needed (wear glove to put on) 4)  Topical Voltaren (NSAID) Gel can help  5)  For flares, corticosteroid injections help. 6)  Hyaluronic Acid injections have good success, average relief is 6 months 7)  Glucosamine and Chondroitin often helpful 8)  Omega-3 fish oils may help 9)  Ice joints on bad days, 20 min, 2-3 x /  day 10)  REGULAR EXERCISE: swimming, Yoga, Tai Chi, bicycle (NON-IMPACT activity  11)  Liver panel: repeat in 3 months Prescriptions: TRAMADOL HCL 50 MG  TABS (TRAMADOL HCL) 1 by mouth 4 times daily as needed pain  #60 x 5   Entered and Authorized by:   Hannah Beat MD   Signed by:   Hannah Beat MD on 09/15/2009   Method used:   Electronically to        [facility] (retail)       [address]       [place], [place]  [address]       Ph: [phone]       Fax: [phone]   RxID:   [id number]   Current Allergies (reviewed today): ! SULFA

## 2010-11-22 NOTE — Assessment & Plan Note (Signed)
Summary: CPX//CYD   Vital Signs:  Patient profile:   55 year old male Height:      71.75 inches Weight:      269.2 pounds BMI:     36.90 Pulse rate:   68 / minute Pulse rhythm:   regular BP sitting:   110 / 70  (left arm) Cuff size:   large  History of Present Illness: Chief complaint adult physical , wants 90 day scripts for all medications  L knee: the patient is status post LEFT knee anterior cruciate ligament reconstruction, with what sounds like a hamstring graft distantly in the 1980s. He continually has knee pain today, and he has had a significant amount of quadriceps wasting. He does have pain going up and down stairs. He feels as if his knee is somewhat loose, and he has some symptomatic giving way at times. He does not describe locking up of his joint. He is not had an operative intervention since his initial anterior cruciate ligament reconstruction. He has not done any formal physical therapy and a significant amount of time. He is limited somewhat in his ability to do exercise with his knee problems.  HTN: HCTZ, amlopidine 10, benzapril 20. the patient has been on these medications for some time, he has been stable, however his copayment for the Lotrel, combination amlodipine and benzapril is significantly high, greater than $100 monthly. he would like to change this to something that is on the board all her plan.  DM: reports that his blood sugars have been well controlled. Occasionally they will be slightly higher when he does eat poorly.  all of his lab work noted below was reviewed with the patient in detail.  Elevated LFT's: the patient has had elevated LFTs, transaminases, with his ALT greater than his AST. I called him several months ago and asked him to discontinue his simvastatin, and he has decrease its use, however not entirely. He has taken approximately 10 doses since the time of that call. He is reluctant to go off of a statin, given that he has some concerning  family history for hyperlipidemia and cardiac conditions.  we reviewed the patient's sleep study report from Dr. Vassie Loll, and he has been wearing his CPAP routinely.  depression: The patient actually is not doing 100% perfectly with his depression, and he feels as if some melancholy returns. Compliant with medications.     he has had some irritability, no suicidality, no homicidality. he is sleeping okay at nighttime.  Allergies: 1)  ! Sulfa  Past History:  Past medical, surgical, family and social histories (including risk factors) reviewed, and no changes noted (except as noted below).  Past Medical History:    Allergic Rhinitis    Diabetes    Hyperlipidemia    Hypertension    Sleep Apnea    Depression            Pulm / Sleep = Dr. 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 Needs to lose weight. Work on your diet: Try to eat minimal fatty foods, plenty of fiber, fruit, and vegetables. Exercise is incredibly important to heart health: Try to exercise for at least 30 minutes 5-6 times a week.   Colonoscopy upcoming next week - o/w up to date on prevention. DRE WNL  Problem # 2:  KNEE PAIN, LEFT (ICD-719.46) Assessment: New given the appearance of his leg and his examination, he may have some increased laxity in his anterior cruciate ligament graft, this could be contributing to some destabilization of his  knee, however, I think the majority of his pain is coming from very poor definition and strength of his quadriceps musculature, and this is destabilizing his knee and its ability to absorb shock. He is also having some significant patellofemoral pain.  I strongly recommended that the patient go back to formal physical therapy for training his quadricep musculature and training his proprioception, but he declined this.  At his followup visit for blood pressure check, I will need to check weight bearing the films to evaluate the degree of osteoarthritic change.  His updated medication list for this problem includes:    Aspirin 81 Mg Tbec (Aspirin) ..... One by mouth every day  Problem # 3:  DEPRESSION (ICD-311) Assessment: Deteriorated not doing well, we'll increase his Celexa to 40 mg daily.  His updated medication list for this problem includes:    Citalopram Hydrobromide 40 Mg Tabs (Citalopram hydrobromide) .Marland Kitchen... Take one tablet by mouth daily  Problem # 4:  DIABETES MELLITUS, TYPE II (ICD-250.00) Assessment: New stable check laboratories  His updated medication list for this problem includes:    Metformin Hcl 500 Mg Tabs (Metformin hcl) .Marland Kitchen... Take one tablet by mouth two times a day    Benazepril Hcl 20 Mg Tabs (Benazepril hcl) .Marland Kitchen... 1 by mouth daily    Aspirin 81 Mg Tbec (Aspirin) ..... One by mouth every day  Orders: Venipuncture (14782) TLB-BMP (Basic Metabolic Panel-BMET) (80048-METABOL) TLB-A1C / Hgb A1C (Glycohemoglobin) (83036-A1C)  Problem # 5:  SLEEP APNEA (ICD-780.57) compliant with wearing his CPAP  Problem # 6:  HYPERTENSION (ICD-401.9) discontinue the Lotrel. We'll add Coreg, which is on the [employer] for dollar planned.  His updated medication list for this problem includes:    Hydrochlorothiazide 25 Mg Tabs (Hydrochlorothiazide) .Marland Kitchen... Take one tablet by mouth once daily    Benazepril Hcl 20 Mg Tabs (Benazepril hcl) .Marland Kitchen... 1 by mouth daily    Carvedilol 3.125 Mg  Tabs (Carvedilol) .Marland Kitchen... 1 by mouth two times a day  Problem # 7:  HYPERLIPIDEMIA (ICD-272.4) given his recent elevated transaminases, I have asked the patient to discontinue his lipid medication for the time being.  If his liver function tests have returned to normal, he will be able to restart a statin.  His updated medication list for this problem includes:    Simvastatin 10 Mg Tabs (Simvastatin) .Marland Kitchen... Take one tablet by mouth at bedtime  Orders: Venipuncture (95621) TLB-Hepatic/Liver Function Pnl (80076-HEPATIC)  Complete Medication List: 1)  Citalopram Hydrobromide 40 Mg Tabs (Citalopram hydrobromide) .... Take one tablet by mouth daily 2)  Hydrochlorothiazide 25 Mg Tabs (Hydrochlorothiazide) .... Take one tablet by mouth once daily 3)  Metformin Hcl 500 Mg Tabs (Metformin hcl) .... Take one tablet by mouth two times a day 4)  Benazepril Hcl 20 Mg Tabs (Benazepril hcl) .Marland Kitchen.. 1 by mouth daily 5)  Simvastatin 10 Mg Tabs (Simvastatin) .... Take one tablet by mouth at bedtime 6)  Carvedilol 3.125 Mg Tabs (Carvedilol) .Marland Kitchen.. 1 by mouth two times a day 7)  Aspirin 81 Mg Tbec (Aspirin) .... One by mouth every day  Patient Instructions: 1)  follow-up 2-3 months 2)  prior to next appt. 3)  Hepatic function panel, v58.69 Prescriptions: METFORMIN HCL 500 MG TABS (METFORMIN HCL) take one tablet by mouth two times a day  #180 x 3   Entered and Authorized by:   Hannah Beat MD   Signed by:   Hannah Beat MD on 02/02/2009   Method used:   Print then Give to Patient   RxID:   [id number] HYDROCHLOROTHIAZIDE 25 MG TABS (HYDROCHLOROTHIAZIDE) take one tablet by mouth once daily  #90 x 3   Entered and Authorized by:   Hannah Beat MD   Signed by:   Hannah Beat MD on 02/02/2009   Method used:   Print then Give to Patient   RxID:   [id number] CITALOPRAM HYDROBROMIDE 40 MG TABS (CITALOPRAM HYDROBROMIDE) take one tablet by mouth daily  #90 x 3   Entered and Authorized by:    Hannah Beat MD   Signed by:   Hannah Beat MD on 02/02/2009   Method used:   Print then Give to Patient   RxID:   [id number] BENAZEPRIL HCL 20 MG  TABS (BENAZEPRIL HCL) 1 by mouth daily  #90 x 3   Entered and Authorized by:   Hannah Beat MD   Signed by:   Hannah Beat MD on 02/02/2009   Method used:   Print then Give to Patient   RxID:   [id number] CARVEDILOL 3.125 MG TABS (CARVEDILOL) 1 by mouth two times a day  #90 x 3   Entered and Authorized by:   Hannah Beat MD   Signed by:   Hannah Beat MD on 02/02/2009   Method used:   Print then Give to Patient   RxID:   [id number]       Current Allergies (reviewed today): ! SULFA Colonoscopy Result Date:  10/23/2006 Colonoscopy Result:  normal Colonoscopy, 2008.   Basic Metabolic Panel: reviewed last results  Na:     137 (10/29/2008 9:41:00 AM)   Cl:        101 (10/29/2008 9:41:00 AM)  K+:     3.9 (10/29/2008 9:41:00 AM)   HCO3:  30 (10/29/2008 9:41:00 AM)  BUN:  13 (10/29/2008 9:41:00 AM)  BS:      132 (10/29/2008 9:41:00 AM)  Cr:      1.0 (10/29/2008 9:41:00 AM)   FBS:      CBC: reviewed last results  WBC:    5.9 (10/29/2008 9:41:00 AM)   Hgb:    16.8 (10/29/2008 9:41:00 AM) RBC:     5.26 (10/29/2008 9:41:00 AM)   Hct:     47.6 (10/29/2008 9:41:00 AM)  Plts:      199 (10/29/2008 9:41:00 AM)   INR:      PT:        Liver Panel: reviewed last results  TBili:    0.9 (10/29/2008 9:41:00 AM)   DBili:    0.1 (10/29/2008 9:41:00 AM) IBili:        AlkPhos:     58 (10/29/2008 9:41:00 AM)  AST/SGOT:      83 (10/29/2008 9:41:00 AM)   ALT/SGPT:    50 (10/29/2008 9:41:00 AM)  TProtein:      7.2 (10/29/2008 9:41:00 AM)  Albumin:      4.3 (10/29/2008 9:41:00 AM)    PSA: 0.64 (10/29/2008 9:41:00 AM) Lipid Panel: reviewed last results Chol:     132 (10/29/2008 9:41:00 AM)  HDL:     29.6 (10/29/2008 1:61:09 AM) LDL:      80 (10/29/2008 9:41:00 AM) LDL Direct:       TG:       112 (10/29/2008 9:41:00 AM) CRP:          Homocysteine:   Review of  Systems General: denies fatigue, malaise, fever, weight loss, PATIENT IS OBESE WITH BMI 36 AND DOES LIMITED PHYSICAL ACTIVITY Eyes: denies blurring, diplopia, irritation, discharge Ear/Nose/Throat: denies ear pain or discharge, nasal obstruction or discharge, sore throat Cardiovascular: denies chest pain, palpitations, paroxysmal nocturnal dyspnea, orthopnea, edema Respiratory: denies coughing, wheezing, dyspnea, hemoptysis Gastrointestinal: denies abdominal pain, dysphagia, nausea, vomiting, diarrhea, constipation Genitourinary: denies hematuria, frequency, urgency, dysuria, discharge, impotence, incontinence Musculoskeletal: denies back pain, NOTABLE KNEE COMPLAINTS ABOVE Skin: denies rashes, itching, lumps, sores, lesions, color change Neurologic: denies syncope, seizures, transient paralysis, weakness, paresthesias Psychiatric: AS NOTED ABOVE, hallucinations, paranoia Endocrine: denies polyuria, polydipsia, polyphagia, weight change, heat or cold intolerance Heme/Lymphatic: denies easy or excessive bruising, history of blood transfusions, anemia, bleeding disorders, adenopathy, chills, sweats Allergic/Immunologic: denies urticaria, hay fever, frequent UTIs; denies HIV high risk behaviors  Otherwise, the pertinent positives and negatives are listed above and in the HPI, otherwise a full review of systems has been reviewed and is negative unless noted positive.     Physical Exam General Appearance: well developed, well nourished, no acute distress Eyes: conjunctiva and lids normal, PERRLA, EOMI, fundi WNL Ears, Nose, Mouth, Throat: TM clear, nares clear, oral exam WNL Neck: supple, no lymphadenopathy, no thyromegaly, no JVD Respiratory: clear to auscultation and percussion, respiratory effort normal Cardiovascular: regular rate and rhythm, S1-S2, no murmur, rub or gallop, no bruits, peripheral pulses normal and symmetric, no cyanosis, clubbing, edema or  varicosities Chest: no scars, masses, tenderness; no asymmetry, skin changes, nipple discharge, no gynecomastia   Gastrointestinal: soft, non-tender; no hepatosplenomegaly, masses; active bowel sounds all quadrants, guaiac negative stool; no masses, tenderness, hemorrhoids  Genitourinary: no hernia, testicular mass, penile discharge, priapism or prostate enlargement, good rectal tone Lymphatic: no cervical, axillary or inguinal adenopathy Musculoskeletal: gait normal the patient has large scar LEFT knee, he also has an extreme amount of quadriceps wasting, on the LEFT side, with minimal VMO appreciable. The patient also has some patellar crepitus present on the LEFT side and some pain with lateral patellar facet loading. Stable MCL and LCL bilaterally. On comparison of Lockman and anterior drawer test, the patient does appear to have greater translation on the LEFT, by approximately 3-4 mm. There does appear to be an endpoint. Negative McMurray's test, negative bounce home test, negative flexion pinch. There is some knee crepitus overall. He does have some mild medial joint line tenderness. Skin: clear, good turgor, color WNL, no rashes, lesions, or ulcerations Neurologic: normal mental status, normal reflexes, normal strength, sensation, and motion Psychiatric: alert; oriented to person, place and time Other Exam:

## 2010-11-30 LAB — HM DIABETES EYE EXAM: HM Diabetic Eye Exam: ABNORMAL

## 2010-12-08 NOTE — Letter (Signed)
Summary: [facility]   Imported By: Kassie Mends 11/30/2010 11:00:28  _____________________________________________________________________  External Attachment:    Type:   Image     Comment:   External Document  Appended Document: [facility] update dm list eye exam  Appended Document: [facility]    Clinical Lists Changes  Observations: Added new observation of DBT EY CK DT: 11/30/2010 (11/30/2010 11:41) Added new observation of DMEYEEXAMNXT: 12/01/2011 (11/30/2010 11:41) Added new observation of DIAB EYE EX: abnormal (11/30/2010 11:41)      Diabetic Eye Exam Date:  11/30/2010 Diabetes Eye Exam Result:  abnormal Diabetes Eye Exam Due:  1 yr

## 2011-01-09 LAB — BASIC METABOLIC PANEL
BUN: 6 mg/dL (ref 6–23)
BUN: 6 mg/dL (ref 6–23)
BUN: 7 mg/dL (ref 6–23)
CO2: 27 mEq/L (ref 19–32)
CO2: 30 mEq/L (ref 19–32)
CO2: 30 mEq/L (ref 19–32)
Calcium: 8.5 mg/dL (ref 8.4–10.5)
Calcium: 8.6 mg/dL (ref 8.4–10.5)
Calcium: 8.7 mg/dL (ref 8.4–10.5)
Chloride: 93 mEq/L — ABNORMAL LOW (ref 96–112)
Chloride: 95 mEq/L — ABNORMAL LOW (ref 96–112)
Chloride: 99 mEq/L (ref 96–112)
Creatinine, Ser: 0.92 mg/dL (ref 0.4–1.5)
Creatinine, Ser: 0.92 mg/dL (ref 0.4–1.5)
Creatinine, Ser: 0.94 mg/dL (ref 0.4–1.5)
GFR calc Af Amer: >60 mL/min (ref >60)
GFR calc Af Amer: >60 mL/min (ref >60)
GFR calc Af Amer: >60 mL/min (ref >60)
GFR calc non Af Amer: >60 mL/min (ref >60)
GFR calc non Af Amer: >60 mL/min (ref >60)
GFR calc non Af Amer: >60 mL/min (ref >60)
Glucose, Bld: 153 mg/dL — ABNORMAL HIGH (ref 70–99)
Glucose, Bld: 157 mg/dL — ABNORMAL HIGH (ref 70–99)
Glucose, Bld: 175 mg/dL — ABNORMAL HIGH (ref 70–99)
Potassium: 3.3 mEq/L — ABNORMAL LOW (ref 3.5–5.1)
Potassium: 3.6 mEq/L (ref 3.5–5.1)
Potassium: 3.9 mEq/L (ref 3.5–5.1)
Sodium: 134 mEq/L — ABNORMAL LOW (ref 135–145)
Sodium: 134 mEq/L — ABNORMAL LOW (ref 135–145)
Sodium: 135 mEq/L (ref 135–145)

## 2011-01-09 LAB — PROTIME-INR
INR: 1 (ref 0.00–1.49)
INR: 1.17 (ref 0.00–1.49)
INR: 1.52 — ABNORMAL HIGH (ref 0.00–1.49)
INR: 2.58 — ABNORMAL HIGH (ref 0.00–1.49)
Prothrombin Time: 13.1 seconds (ref 11.6–15.2)
Prothrombin Time: 14.8 seconds (ref 11.6–15.2)
Prothrombin Time: 18.2 seconds — ABNORMAL HIGH (ref 11.6–15.2)
Prothrombin Time: 27.5 seconds — ABNORMAL HIGH (ref 11.6–15.2)

## 2011-01-09 LAB — GLUCOSE, CAPILLARY
Glucose-Capillary: 122 mg/dL — ABNORMAL HIGH (ref 70–99)
Glucose-Capillary: 127 mg/dL — ABNORMAL HIGH (ref 70–99)
Glucose-Capillary: 128 mg/dL — ABNORMAL HIGH (ref 70–99)
Glucose-Capillary: 131 mg/dL — ABNORMAL HIGH (ref 70–99)
Glucose-Capillary: 134 mg/dL — ABNORMAL HIGH (ref 70–99)
Glucose-Capillary: 134 mg/dL — ABNORMAL HIGH (ref 70–99)
Glucose-Capillary: 143 mg/dL — ABNORMAL HIGH (ref 70–99)
Glucose-Capillary: 145 mg/dL — ABNORMAL HIGH (ref 70–99)
Glucose-Capillary: 153 mg/dL — ABNORMAL HIGH (ref 70–99)
Glucose-Capillary: 156 mg/dL — ABNORMAL HIGH (ref 70–99)
Glucose-Capillary: 158 mg/dL — ABNORMAL HIGH (ref 70–99)
Glucose-Capillary: 162 mg/dL — ABNORMAL HIGH (ref 70–99)
Glucose-Capillary: 172 mg/dL — ABNORMAL HIGH (ref 70–99)
Glucose-Capillary: 172 mg/dL — ABNORMAL HIGH (ref 70–99)
Glucose-Capillary: 190 mg/dL — ABNORMAL HIGH (ref 70–99)

## 2011-01-09 LAB — COMPREHENSIVE METABOLIC PANEL
ALT: 53 U/L (ref 0–53)
AST: 36 U/L (ref 0–37)
Albumin: 4.4 g/dL (ref 3.5–5.2)
Alkaline Phosphatase: 64 U/L (ref 39–117)
BUN: 12 mg/dL (ref 6–23)
CO2: 23 mEq/L (ref 19–32)
Calcium: 9.5 mg/dL (ref 8.4–10.5)
Chloride: 99 mEq/L (ref 96–112)
Creatinine, Ser: 1.04 mg/dL (ref 0.4–1.5)
GFR calc Af Amer: >60 mL/min (ref >60)
GFR calc non Af Amer: >60 mL/min (ref >60)
Glucose, Bld: 123 mg/dL — ABNORMAL HIGH (ref 70–99)
Potassium: 4 mEq/L (ref 3.5–5.1)
Sodium: 138 mEq/L (ref 135–145)
Total Bilirubin: 0.8 mg/dL (ref 0.3–1.2)
Total Protein: 7.8 g/dL (ref 6.0–8.3)

## 2011-01-09 LAB — CBC
HCT: 37 % — ABNORMAL LOW (ref 39.0–52.0)
HCT: 37.3 % — ABNORMAL LOW (ref 39.0–52.0)
HCT: 41.5 % (ref 39.0–52.0)
HCT: 50 % (ref 39.0–52.0)
Hemoglobin: 12.8 g/dL — ABNORMAL LOW (ref 13.0–17.0)
Hemoglobin: 13 g/dL (ref 13.0–17.0)
Hemoglobin: 14.3 g/dL (ref 13.0–17.0)
Hemoglobin: 17.1 g/dL — ABNORMAL HIGH (ref 13.0–17.0)
MCHC: 34.2 g/dL (ref 30.0–36.0)
MCHC: 34.5 g/dL (ref 30.0–36.0)
MCHC: 34.6 g/dL (ref 30.0–36.0)
MCHC: 34.8 g/dL (ref 30.0–36.0)
MCV: 91.6 fL (ref 78.0–100.0)
MCV: 91.7 fL (ref 78.0–100.0)
MCV: 91.8 fL (ref 78.0–100.0)
MCV: 92.1 fL (ref 78.0–100.0)
Platelets: 168 10*3/uL (ref 150–400)
Platelets: 172 10*3/uL (ref 150–400)
Platelets: 181 10*3/uL (ref 150–400)
Platelets: 189 10*3/uL (ref 150–400)
RBC: 4.03 MIL/uL — ABNORMAL LOW (ref 4.22–5.81)
RBC: 4.07 MIL/uL — ABNORMAL LOW (ref 4.22–5.81)
RBC: 4.53 MIL/uL (ref 4.22–5.81)
RBC: 5.42 MIL/uL (ref 4.22–5.81)
RDW: 12.3 % (ref 11.5–15.5)
RDW: 12.6 % (ref 11.5–15.5)
RDW: 12.6 % (ref 11.5–15.5)
RDW: 12.7 % (ref 11.5–15.5)
WBC: 11.3 10*3/uL — ABNORMAL HIGH (ref 4.0–10.5)
WBC: 11.8 10*3/uL — ABNORMAL HIGH (ref 4.0–10.5)
WBC: 12.3 10*3/uL — ABNORMAL HIGH (ref 4.0–10.5)
WBC: 7.2 10*3/uL (ref 4.0–10.5)

## 2011-01-09 LAB — ABO/RH: ABO/RH(D): O POS

## 2011-01-09 LAB — URINALYSIS, ROUTINE W REFLEX MICROSCOPIC
Bilirubin Urine: NEGATIVE
Glucose, UA: NEGATIVE mg/dL
Hgb urine dipstick: NEGATIVE
Ketones, ur: NEGATIVE mg/dL
Nitrite: NEGATIVE
Protein, ur: NEGATIVE mg/dL
Specific Gravity, Urine: 1.017 (ref 1.005–1.030)
Urobilinogen, UA: 0.2 mg/dL (ref 0.0–1.0)
pH: 6.5 (ref 5.0–8.0)

## 2011-01-09 LAB — TYPE AND SCREEN
ABO/RH(D): O POS
Antibody Screen: NEGATIVE

## 2011-01-09 LAB — APTT: aPTT: 32 seconds (ref 24–37)

## 2011-01-23 ENCOUNTER — Other Ambulatory Visit: Payer: Self-pay | Admitting: Family Medicine

## 2011-02-03 ENCOUNTER — Encounter: Payer: Self-pay | Admitting: Family Medicine

## 2011-02-03 ENCOUNTER — Other Ambulatory Visit (INDEPENDENT_AMBULATORY_CARE_PROVIDER_SITE_OTHER): Payer: BC Managed Care – HMO | Admitting: Family Medicine

## 2011-02-03 DIAGNOSIS — Z125 Encounter for screening for malignant neoplasm of prostate: Secondary | ICD-10-CM

## 2011-02-03 DIAGNOSIS — E119 Type 2 diabetes mellitus without complications: Secondary | ICD-10-CM

## 2011-02-03 DIAGNOSIS — E785 Hyperlipidemia, unspecified: Secondary | ICD-10-CM

## 2011-02-03 DIAGNOSIS — E7800 Pure hypercholesterolemia, unspecified: Secondary | ICD-10-CM

## 2011-02-03 DIAGNOSIS — I10 Essential (primary) hypertension: Secondary | ICD-10-CM

## 2011-02-03 LAB — COMPREHENSIVE METABOLIC PANEL
ALT: 100 U/L — ABNORMAL HIGH (ref 0–53)
AST: 64 U/L — ABNORMAL HIGH (ref 0–37)
Albumin: 4.3 g/dL (ref 3.5–5.2)
Alkaline Phosphatase: 49 U/L (ref 39–117)
BUN: 13 mg/dL (ref 6–23)
CO2: 29 mEq/L (ref 19–32)
Calcium: 9.6 mg/dL (ref 8.4–10.5)
Chloride: 101 mEq/L (ref 96–112)
Creatinine, Ser: 0.9 mg/dL (ref 0.4–1.5)
GFR: 89.39 mL/min (ref >60.00)
Glucose, Bld: 123 mg/dL — ABNORMAL HIGH (ref 70–99)
Potassium: 4.8 mEq/L (ref 3.5–5.1)
Sodium: 139 mEq/L (ref 135–145)
Total Bilirubin: 0.9 mg/dL (ref 0.3–1.2)
Total Protein: 7.5 g/dL (ref 6.0–8.3)

## 2011-02-03 LAB — CBC WITH DIFFERENTIAL/PLATELET
Basophils Absolute: 0 10*3/uL (ref 0.0–0.1)
Basophils Relative: 0.6 % (ref 0.0–3.0)
Eosinophils Absolute: 0.2 10*3/uL (ref 0.0–0.7)
Eosinophils Relative: 2.4 % (ref 0.0–5.0)
HCT: 49.9 % (ref 39.0–52.0)
Hemoglobin: 17.5 g/dL — ABNORMAL HIGH (ref 13.0–17.0)
Lymphocytes Relative: 32.9 % (ref 12.0–46.0)
Lymphs Abs: 2.1 10*3/uL (ref 0.7–4.0)
MCHC: 35.1 g/dL (ref 30.0–36.0)
MCV: 91.9 fl (ref 78.0–100.0)
Monocytes Absolute: 0.6 10*3/uL (ref 0.1–1.0)
Monocytes Relative: 8.5 % (ref 3.0–12.0)
Neutro Abs: 3.6 10*3/uL (ref 1.4–7.7)
Neutrophils Relative %: 55.6 % (ref 43.0–77.0)
Platelets: 159 10*3/uL (ref 150.0–400.0)
RBC: 5.43 Mil/uL (ref 4.22–5.81)
RDW: 13.5 % (ref 11.5–14.6)
WBC: 6.4 10*3/uL (ref 4.5–10.5)

## 2011-02-03 LAB — LIPID PANEL
Cholesterol: 219 mg/dL — ABNORMAL HIGH (ref 0–200)
HDL: 43.1 mg/dL (ref >39.00)
Total CHOL/HDL Ratio: 5
Triglycerides: 216 mg/dL — ABNORMAL HIGH (ref 0.0–149.0)
VLDL: 43.2 mg/dL — ABNORMAL HIGH (ref 0.0–40.0)

## 2011-02-03 LAB — LDL CHOLESTEROL, DIRECT: Direct LDL: 147.2 mg/dL

## 2011-02-03 LAB — MICROALBUMIN / CREATININE URINE RATIO
Creatinine,U: 81 mg/dL
Microalb Creat Ratio: 2.3 mg/g (ref 0.0–30.0)
Microalb, Ur: 1.9 mg/dL (ref 0.0–1.9)

## 2011-02-03 LAB — TSH: TSH: 0.89 u[IU]/mL (ref 0.35–5.50)

## 2011-02-03 LAB — HEMOGLOBIN A1C: Hgb A1c MFr Bld: 6.7 % — ABNORMAL HIGH (ref 4.6–6.5)

## 2011-02-03 LAB — PSA: PSA: 0.57 ng/mL (ref 0.10–4.00)

## 2011-02-06 ENCOUNTER — Encounter: Payer: Self-pay | Admitting: Family Medicine

## 2011-02-23 ENCOUNTER — Ambulatory Visit (INDEPENDENT_AMBULATORY_CARE_PROVIDER_SITE_OTHER): Payer: BC Managed Care – HMO | Admitting: Family Medicine

## 2011-02-23 ENCOUNTER — Encounter: Payer: Self-pay | Admitting: Family Medicine

## 2011-02-23 VITALS — BP 120/70 | HR 77 | Temp 97.9°F | Ht 70.0 in | Wt 288.4 lb

## 2011-02-23 DIAGNOSIS — E785 Hyperlipidemia, unspecified: Secondary | ICD-10-CM

## 2011-02-23 DIAGNOSIS — Z0000 Encounter for general adult medical examination without abnormal findings: Secondary | ICD-10-CM

## 2011-02-23 DIAGNOSIS — E119 Type 2 diabetes mellitus without complications: Secondary | ICD-10-CM

## 2011-02-23 DIAGNOSIS — G4730 Sleep apnea, unspecified: Secondary | ICD-10-CM

## 2011-02-23 DIAGNOSIS — F3289 Other specified depressive episodes: Secondary | ICD-10-CM

## 2011-02-23 DIAGNOSIS — Z8639 Personal history of other endocrine, nutritional and metabolic disease: Secondary | ICD-10-CM

## 2011-02-23 DIAGNOSIS — R5381 Other malaise: Secondary | ICD-10-CM

## 2011-02-23 DIAGNOSIS — I10 Essential (primary) hypertension: Secondary | ICD-10-CM

## 2011-02-23 DIAGNOSIS — F329 Major depressive disorder, single episode, unspecified: Secondary | ICD-10-CM

## 2011-02-23 DIAGNOSIS — Z862 Personal history of diseases of the blood and blood-forming organs and certain disorders involving the immune mechanism: Secondary | ICD-10-CM

## 2011-02-23 MED ORDER — CITALOPRAM HYDROBROMIDE 40 MG PO TABS: 40.0000 mg | ORAL_TABLET | Freq: Every day | ORAL | Status: DC

## 2011-02-23 NOTE — Patient Instructions (Signed)
REFERRAL: GO THE THE FRONT ROOM AT THE ENTRANCE OF OUR CLINIC, NEAR CHECK IN. ASK FOR MARION. SHE WILL HELP YOU SET UP YOUR REFERRAL. DATE: TIME:  DR. DOHMEIER   RECHECK LIVER IN 3 MONTHS

## 2011-02-23 NOTE — Progress Notes (Signed)
55 year old male, here for full CPX and multiple medical problems.  Preventative Health Maintenance Visit:  Health Maintenance Summary Reviewed and updated, unless pt declines services.  Tobacco History Reviewed. Alcohol: No concerns, no excessive use Exercise Habits: No activity right now, rec at least 30 mins 5 times a week STD concerns: no risk or activity to increase risk Drug Use: None  Labs reviewed with the patient.     Chemistry      Component Value Date/Time   NA 139 02/03/2011 0901   K 4.8 02/03/2011 0901   CL 101 02/03/2011 0901   CO2 29 02/03/2011 0901   BUN 13 02/03/2011 0901   CREATININE 0.9 02/03/2011 0901      Component Value Date/Time   CALCIUM 9.6 02/03/2011 0901   ALKPHOS 49 02/03/2011 0901   AST 64* 02/03/2011 0901   ALT 100* 02/03/2011 0901   BILITOT 0.9 02/03/2011 0901       Lab Results  Component Value Date   PSA 0.57 02/03/2011   PSA 0.64 10/29/2008    Diabetes Mellitus: Tolerating Medications: y Compliance with diet: fair Exercise: none Foot problems: none Hypoglycemia: none No nausea, vomitting, blurred vision, polyuria.  Lab Results  Component Value Date   HGBA1C 6.7* 02/03/2011      Chemistry      Component Value Date/Time   NA 139 02/03/2011 0901   K 4.8 02/03/2011 0901   CL 101 02/03/2011 0901   CO2 29 02/03/2011 0901   BUN 13 02/03/2011 0901   CREATININE 0.9 02/03/2011 0901      Component Value Date/Time   CALCIUM 9.6 02/03/2011 0901   ALKPHOS 49 02/03/2011 0901   AST 64* 02/03/2011 0901   ALT 100* 02/03/2011 0901   BILITOT 0.9 02/03/2011 0901     HTN: Tolerating all medications without side effects Stable and at goal No CP, no sob. No HA.  BP Readings from Last 3 Encounters:  02/23/11 120/70  08/04/10 112/80  06/23/10 110/78   Lipids: At this point, the patient is off medication, and his lipids are not at goal LFT's have been elevated for decades without significant change per the patient.  Lab Results  Component Value Date   CHOL 219* 02/03/2011   CHOL 181 01/10/2010   CHOL 188 09/03/2009   Lab Results  Component Value Date   HDL 43.10 02/03/2011   HDL 16.10 01/10/2010   HDL 96.04* 09/03/2009   Lab Results  Component Value Date   LDLCALC 109* 01/10/2010   LDLCALC 117* 09/03/2009   LDLCALC 80 10/29/2008   Lab Results  Component Value Date   TRIG 216.0* 02/03/2011   TRIG 165.0* 01/10/2010   TRIG 159.0* 09/03/2009   Lab Results  Component Value Date   CHOLHDL 5 02/03/2011   CHOLHDL 5 01/10/2010   CHOLHDL 5 09/03/2009   Lab Results  Component Value Date   LDLDIRECT 147.2 02/03/2011    Lab Results  Component Value Date   ALT 100* 02/03/2011   AST 64* 02/03/2011   ALKPHOS 49 02/03/2011   BILITOT 0.9 02/03/2011    Dep: Stable, no concerns regarding depression and anxiety  Not sleeping well. OSA - Has a mask currently, had a sleep study a few years ago, saw Dr. Vassie Loll once, but he never followed up with him. The patient still feels tired and fatigued all day, sleeps very poorly. Daytime somnolence is present despite CPAP use.  Patient Active Problem List  Diagnoses  . DIABETES MELLITUS, TYPE II  .  HYPERLIPIDEMIA  . DEPRESSION  . HYPERTENSION  . ALLERGIC RHINITIS  . SLEEP APNEA  . FATIGUE  . LIVER FUNCTION TESTS, ABNORMAL, HX OF  . Routine general medical examination at a health care facility   Past Medical History  Diagnosis Date  . Diabetes mellitus   . Hyperlipidemia   . Hypertension   . Depression   . Allergy     allergic rhinitis  . Sleep apnea    Past Surgical History  Procedure Date  . Tonsillectomy 1969  . 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 Knee arthroscopy w/ acl reconstruction and hamstring graft 1985   History  Substance Use Topics  . Smoking status: Never Smoker   . Smokeless tobacco: Not on file  . Alcohol Use: No   Family History  Problem Relation Age of Onset  . Emphysema Father   . Heart disease Mother    Allergies  Allergen Reactions  .  Sulfonamide Derivatives    Current Outpatient Prescriptions on File Prior to Visit  Medication Sig Dispense Refill  . aspirin 81 MG tablet Take 81 mg by mouth daily.        . benazepril (LOTENSIN) 20 MG tablet Take 20 mg by mouth daily.        . carvedilol (COREG) 3.125 MG tablet TAKE ONE TABLET BY MOUTH TWICE DAILY  60 tablet  6  . hydrochlorothiazide 25 MG tablet Take 25 mg by mouth daily.        . metFORMIN (GLUCOPHAGE) 500 MG tablet Take 500 mg by mouth 2 (two) times daily with a meal.        . simvastatin (ZOCOR) 10 MG tablet Take 10 mg by mouth at bedtime.        . traZODone (DESYREL) 100 MG tablet 1/2 to 1 tablet by mouth at bedtime as needed insomnia         General: Denies fever, chills, sweats. No significant weight loss. Eyes: Denies blurring,significant itching ENT: Denies earache, sore throat, and hoarseness. Cardiovascular: Denies chest pains, palpitations, dyspnea on exertion Respiratory: Denies cough, dyspnea at rest,wheeezing Breast: no concerns about lumps GI: Denies nausea, vomiting, diarrhea, constipation, change in bowel habits, abdominal pain, melena, hematochezia GU: Denies penile discharge, ED, urinary flow / outflow problems. No STD concerns. Musculoskeletal: occ joint PAIN BUT DOING BETTER AFTER TKA Derm: Denies rash, itching Neuro: Denies  paresthesias, frequent falls, frequent headaches. AS ABOVE NOTED POOR SLEEP, FATIGUE Psych: Denies depression, anxiety Endocrine: Denies cold intolerance, heat intolerance, polydipsia Heme: Denies enlarged lymph nodes Allergy: No hayfever  PE: GEN: well developed, well nourished, no acute distress Eyes: conjunctiva and lids normal, PERRLA, EOMI ENT: TM clear, nares clear, oral exam WNL Neck: supple, no lymphadenopathy, no thyromegaly, no JVD Pulm: clear to auscultation and percussion, respiratory effort normal CV: regular rate and rhythm, S1-S2, no murmur, rub or gallop, no bruits, peripheral pulses normal and  symmetric, no cyanosis, clubbing, edema or varicosities Chest: no scars, masses, no gynecomastia   GI: soft, non-tender; no hepatosplenomegaly, masses; active bowel sounds all quadrants GU: no hernia, testicular mass, penile discharge, priapism or prostate enlargement Lymph: no cervical, axillary or inguinal adenopathy MSK: gait normal, muscle tone and strength WNL, no joint swelling, effusions, discoloration, crepitus  SKIN: clear, good turgor, color WNL, no rashes, lesions, or ulcerations Neuro: normal mental status, normal strength, sensation, and motion Psych: alert; oriented to person, place and time, normally interactive and not anxious or depressed in appearance.

## 2011-02-24 ENCOUNTER — Encounter: Payer: Self-pay | Admitting: Family Medicine

## 2011-02-24 DIAGNOSIS — Z1211 Encounter for screening for malignant neoplasm of colon: Secondary | ICD-10-CM | POA: Insufficient documentation

## 2011-02-24 DIAGNOSIS — Z0000 Encounter for general adult medical examination without abnormal findings: Secondary | ICD-10-CM | POA: Insufficient documentation

## 2011-02-24 NOTE — Assessment & Plan Note (Signed)
Longstanding problem, will continue to follow, recheck LFT's in 3 mo given start of statin in this setting of probable fatty liver

## 2011-02-24 NOTE — Assessment & Plan Note (Signed)
stable °

## 2011-02-24 NOTE — Assessment & Plan Note (Signed)
The patient's preventative maintenance and recommended screening tests for an annual wellness exam were reviewed in full today. Brought up to date unless services declined.  Counselled on the importance of diet, exercise, and its role in overall health and mortality. The patient's FH and SH was reviewed, including their home life, tobacco status, and drug and alcohol status.    

## 2011-02-24 NOTE — Assessment & Plan Note (Signed)
At goal and compliant

## 2011-02-24 NOTE — Assessment & Plan Note (Signed)
Stable and doing well. Still very heavy. Rec diet and lifestyle changes

## 2011-02-24 NOTE — Assessment & Plan Note (Signed)
Doing very poorly, compliant, but continues with daytime somnolence, a feeling of not feeling refreshed when waking up and does not feel satisfied with his current CPAP set-up. This certainly sounds typical of OSA. Seen Dr. Vassie Loll once before, but will seek a second opinion (preference) and ask Dr. Vickey Huger for her opinion.

## 2011-02-24 NOTE — Assessment & Plan Note (Signed)
Not at goal. Reviewed with patient most recent studies regarding statins and LFT's. Risk of liver failure is the same as that of the general population where on a statin or not. Chronically elevated LFT's in an almost 300 pound patient. Likely fatty liver. Will restart statin.

## 2011-02-27 ENCOUNTER — Other Ambulatory Visit: Payer: Self-pay | Admitting: Family Medicine

## 2011-03-07 NOTE — Procedures (Signed)
NAME:  Tyler Ramsey, Tyler Ramsey NO.:  [id number]   MEDICAL RECORD NO.:  [id number]          PATIENT TYPE:  OUT   LOCATION:  SLEEP CENTER                 FACILITY:  [facility]   PHYSICIAN:  Oretha Milch, MD      DATE OF BIRTH:  02-07-1956   DATE OF STUDY:  10/29/2008                            NOCTURNAL POLYSOMNOGRAM   REFERRING PHYSICIAN:   INDICATION FOR STUDY:  Excessive fatigue, known obstructive sleep apnea,  diagnosed in [place] 10 years ago for this 55 year old gentleman with  weight gain, sounds there is no study.  At the time of the study, he  weighed 265 pounds, height of 5 feet 10 inches with a BMI of 38, and  neck size is 18 inches.   EPWORTH SLEEPINESS SCORE:  Epworth sleepiness score was 4/24.   MEDICATIONS:  Medications at the time of the study include amlodipine,  citalopram, metformin, and simvastatin.   This CPAP titration study was performed with a sleep technologist in  attendance.  EEG, EOG, EMG, EKG, and respiratory parameters were  recorded.  Sleep stages, arousals, limb movements, and respiratory data  were scored according to criteria laid out by the American Academy of  Sleep Medicine.   SLEEP ARCHITECTURE:  Lights-out was at 9:44 p.m. and lights-on was at  5:12 a.m.  Total sleep time was 389 minutes with a sleep period time of  434 minutes and sleep efficiency of 87%.  Sleep latency was 14 minutes.  Latency to REM sleep was 139 minutes.  REM sleep was noted in 3  different periods with the largest period of around 2:30 a.m.  Sleep  stages as a percentage of total sleep time was N1 4.5%, N2 69.2%, N3  4.9%, and REM sleep 21.5% (83.5 minutes).  No supine sleep was noted.  He slept either in the lateral decubitus or prone position.   AROUSAL DATA:  There were a total of 62 arousals with an arousal index  of 9.6 events per hour, of these 57 was spontaneous and 5 were  associated with desaturations.   RESPIRATORY DATA:  At a level of 5 cm for 42  minutes of sleep, 2  hypopneas were noted with a desaturation of 91%.  At the level of 9 cm  for 41 minutes of sleep, 3 obstructive apneas, 1 central apnea, and 4  hypopneas were noted with an AHI of 11.7 events per hour and lowest  desaturation of 91%.  At level of 12 cm for 274 minutes of sleep  including 84 minutes of REM sleep, 1 obstructive apnea, 2 hypopneas were  noted with an AHI of 0.7 events per hour and the lowest desaturation of  92%.   OXYGEN DATA:  Oxygen saturation data as noted above.   CARDIAC DATA:  The lowest heart rate was 61 beats per minute.  The high  heart rate was 103 beats per minute.  No arrhythmias were noted.   DISCUSSION:  No supine sleep was noted.  Events are well controlled with  12 cm of pressure.  He seem to tolerate this well, some episodes of  paroxysm were noted.  A chin strap was  added due to mouth breathing.  A  medium nasal Ultra Mirage mask was used.   MOVEMENT-PARASOMNIA:  Limb movement index was 14.7 events per hour.  Periodic limb movement index was 4.9 events per hour.  None of these  were associated with arousal.   IMPRESSIONS-RECOMMENDATIONS:  1. Obstructive sleep apnea with hypopnea causing arousal and sleep      fragmentation.  2. This was corrected by continuous positive airway pressure of 12 cm      with a medium nasal mask and a chin strap.  3. No evidence of cardiac arrhythmias or behavioral disturbance during      sleep.  4. Limb movements were noted but these are not associated with      arousal.   RECOMMENDATIONS:  1. CPAP of 12 cm with medium nasal mask and chin strap.  2. He should be monitored for compliance of this level.  3. He should be asked to avoid medications with sedative side effects.      He should be cautioned against driving when sleepy.      Oretha Milch, MD  Electronically Signed     RVA/MEDQ  D:  11/10/2008 12:41:40  T:  11/11/2008 03:09:59  Job:  [id number]

## 2011-05-26 ENCOUNTER — Ambulatory Visit (INDEPENDENT_AMBULATORY_CARE_PROVIDER_SITE_OTHER): Payer: BC Managed Care – HMO | Admitting: Family Medicine

## 2011-05-26 ENCOUNTER — Encounter: Payer: Self-pay | Admitting: Family Medicine

## 2011-05-26 VITALS — BP 132/82 | HR 73 | Temp 98.5°F | Wt 294.5 lb

## 2011-05-26 DIAGNOSIS — R300 Dysuria: Secondary | ICD-10-CM | POA: Insufficient documentation

## 2011-05-26 LAB — POCT URINALYSIS DIPSTICK
Bilirubin, UA: NEGATIVE
Glucose, UA: NEGATIVE
Ketones, UA: NEGATIVE
Leukocytes, UA: NEGATIVE
Nitrite, UA: NEGATIVE
Spec Grav, UA: 1.02
Urobilinogen, UA: NEGATIVE
pH, UA: 5

## 2011-05-26 MED ORDER — OXYBUTYNIN CHLORIDE 5 MG PO TABS: 5.0000 mg | ORAL_TABLET | Freq: Two times a day (BID) | ORAL | Status: DC

## 2011-05-26 NOTE — Patient Instructions (Signed)
Start the ditropan.  Start with 1 tab a day.  We'll contact you with your lab report.  Take care.

## 2011-05-26 NOTE — Progress Notes (Signed)
"  UTI"  Urinary frequency and urgency for 2 days.  Sugar has been up a little, 137 this AM.  No very high sugars.  No burning with urination.  No high risk sexual activity per patient.  He backed off on fluids and that may have helped, but only a little.  No FCNAVD.  Feeling well o/w.  He is trying to cut down on caffeine.    Meds, vitals, and allergies reviewed.   ROS: See HPI.  Otherwise, noncontributory.  nad ncat rrr ctab Prostate not ttp and not enlarged.   Normal ext genitalia  Blood on u/a dip.  ucx pending.

## 2011-05-27 NOTE — Assessment & Plan Note (Signed)
Start ditropan for spasm/urgency and check UCx.  I don't think this is prostatitis.  He agrees with plan.  Nontoxic. If ucx is pos, treat and then recheck afterward.  If ucx is neg, then refer to uro for hematuria eval.  He agrees with plan.

## 2011-05-28 ENCOUNTER — Telehealth: Payer: Self-pay | Admitting: Family Medicine

## 2011-05-28 LAB — URINE CULTURE
Colony Count: NO GROWTH
Organism ID, Bacteria: NO GROWTH

## 2011-05-28 NOTE — Telephone Encounter (Signed)
Please call pt.  Ucx negative.  I want him to talk to uro given the blood in his urine.  I put in the referral. Thanks.

## 2011-05-29 ENCOUNTER — Encounter: Payer: Self-pay | Admitting: Family Medicine

## 2011-05-29 NOTE — Telephone Encounter (Signed)
Patient notified

## 2011-06-01 ENCOUNTER — Telehealth: Payer: Self-pay | Admitting: *Deleted

## 2011-06-01 NOTE — Telephone Encounter (Signed)
Copy of office visit note and labs from 8/3 faxed to Dr. Blair Heys office, where pt has upcoming appt.  Fax number [phone].

## 2011-07-05 ENCOUNTER — Other Ambulatory Visit: Payer: Self-pay | Admitting: Family Medicine

## 2011-08-09 ENCOUNTER — Encounter: Payer: Self-pay | Admitting: Family Medicine

## 2011-08-09 ENCOUNTER — Ambulatory Visit (INDEPENDENT_AMBULATORY_CARE_PROVIDER_SITE_OTHER): Payer: BC Managed Care – HMO | Admitting: Family Medicine

## 2011-08-09 DIAGNOSIS — I10 Essential (primary) hypertension: Secondary | ICD-10-CM

## 2011-08-09 DIAGNOSIS — E66813 Obesity, class 3: Secondary | ICD-10-CM

## 2011-08-09 DIAGNOSIS — Z862 Personal history of diseases of the blood and blood-forming organs and certain disorders involving the immune mechanism: Secondary | ICD-10-CM

## 2011-08-09 DIAGNOSIS — Z8639 Personal history of other endocrine, nutritional and metabolic disease: Secondary | ICD-10-CM

## 2011-08-09 DIAGNOSIS — E785 Hyperlipidemia, unspecified: Secondary | ICD-10-CM

## 2011-08-09 DIAGNOSIS — E119 Type 2 diabetes mellitus without complications: Secondary | ICD-10-CM

## 2011-08-09 LAB — HEPATIC FUNCTION PANEL
ALT: 95 U/L — ABNORMAL HIGH (ref 0–53)
AST: 79 U/L — ABNORMAL HIGH (ref 0–37)
Albumin: 4.6 g/dL (ref 3.5–5.2)
Alkaline Phosphatase: 59 U/L (ref 39–117)
Bilirubin, Direct: 0 mg/dL (ref 0.0–0.3)
Total Bilirubin: 1.1 mg/dL (ref 0.3–1.2)
Total Protein: 7.9 g/dL (ref 6.0–8.3)

## 2011-08-09 LAB — HEMOGLOBIN A1C: Hgb A1c MFr Bld: 7 % — ABNORMAL HIGH (ref 4.6–6.5)

## 2011-08-09 NOTE — Progress Notes (Addendum)
Subjective:    Patient ID: Tyler Ramsey, male    DOB: [birth year], 55 y.o.   MRN: [id number]  HPI  Tyler Ramsey, a 55 y.o. male presents today in the office for the following:    Diabetes Mellitus: Tolerating Medications: Compliance with diet: +/-, BMI 42 Exercise: minimal Foot problems: none Hypoglycemia: none No nausea, vomitting, blurred vision, polyuria.  Lab Results  Component Value Date   HGBA1C 6.7* 02/03/2011    Basic Metabolic Panel:    Component Value Date/Time   NA 139 02/03/2011 0901   K 4.8 02/03/2011 0901   CL 101 02/03/2011 0901   CO2 29 02/03/2011 0901   BUN 13 02/03/2011 0901   CREATININE 0.9 02/03/2011 0901   GLUCOSE 123* 02/03/2011 0901   CALCIUM 9.6 02/03/2011 0901   HTN: Tolerating all medications without side effects Stable and at goal No CP, no sob. No HA.  BP Readings from Last 3 Encounters:  08/09/11 120/72  05/26/11 132/82  02/23/11 120/70    Basic Metabolic Panel:    Component Value Date/Time   NA 139 02/03/2011 0901   K 4.8 02/03/2011 0901   CL 101 02/03/2011 0901   CO2 29 02/03/2011 0901   BUN 13 02/03/2011 0901   CREATININE 0.9 02/03/2011 0901   GLUCOSE 123* 02/03/2011 0901   CALCIUM 9.6 02/03/2011 0901   Lipids: Doing well, stable. Tolerating meds fine with no SE. On low dose statin, could not tol higher doses  Recheck HFP, a1c, lipids  Wants to consider and think about bariatric surgery.  Dr. Anselm Lis in [place].  Morbid obesity: The patient has some questions about bariatric surgery, and he is morbidly obese with a BMI of 42. He also has diabetes, hyperlipidemia, hypertension, and sleep apnea. This is reasonable and a good question to ask.   The PMH, PSH, Social History, Family History, Medications, and allergies have been reviewed in [facility], and have been updated if relevant.   Review of Systems ROS: GEN: No acute illnesses, no fevers, chills. GI: No n/v/d, eating normally Pulm: No SOB Interactive and getting along well at  home.  Otherwise, ROS is as per the HPI.     Objective:   Physical Exam   Physical Exam  Blood pressure 120/72, pulse 68, temperature 97.9 F (36.6 C), temperature source Oral, height 5' 9.5" (1.765 m), weight 290 lb 6.4 oz (131.725 kg), SpO2 97.00%.  GEN: WDWN, NAD, Non-toxic, A & O x 3 HEENT: Atraumatic, Normocephalic. Neck supple. No masses, No LAD. Ears and Nose: No external deformity. CV: RRR, No M/G/R. No JVD. No thrill. No extra heart sounds. PULM: CTA B, no wheezes, crackles, rhonchi. No retractions. No resp. distress. No accessory muscle use. EXTR: No c/c/e NEURO Normal gait.  PSYCH: Normally interactive. Conversant. Not depressed or anxious appearing.  Calm demeanor.   Diabetic foot exam: Normal inspection No skin breakdown No calluses  Normal DP pulses Normal sensation to light tough Nails normal      Assessment & Plan:   1. Obesity, Class III, BMI 40-49.9 (morbid obesity) : Discussed with the patient, and I recommended that he have a formal consult to discuss various options with the bariatric clinic at [facility], where they have several bariatric surgeons. He also discussed having a consult with Dr. Anselm Lis who works out of [place] and [facility], which I also think is reasonable.  Ambulatory referral to General Surgery  2. DIABETES MELLITUS, TYPE II : stable Hemoglobin A1c  3. HYPERTENSION :stable  4. LIVER FUNCTION TESTS, ABNORMAL, HX OF , recheck Hepatic function panel  5. HYPERLIPIDEMIA : Not ago, but the patient is able to tolerate his Zocor at 10 mg right now without any problem. Ideally he could lose a significant amount of weight and work on his diet.      We discussed his questions about bariatric surgery, and I gave him literature, as well as recommended the patient cut calories, work on his diet, restrict his portion size, and begin an exercise program.

## 2011-08-09 NOTE — Patient Instructions (Signed)
REFERRAL: GO THE THE FRONT ROOM AT THE ENTRANCE OF OUR CLINIC, NEAR CHECK IN. ASK FOR MARION. SHE WILL HELP YOU SET UP YOUR REFERRAL. DATE: TIME:    [facility], [place]  Several bariatric surgeons

## 2011-09-12 ENCOUNTER — Ambulatory Visit (INDEPENDENT_AMBULATORY_CARE_PROVIDER_SITE_OTHER): Payer: BC Managed Care – HMO | Admitting: Family Medicine

## 2011-09-12 ENCOUNTER — Encounter: Payer: Self-pay | Admitting: Family Medicine

## 2011-09-12 DIAGNOSIS — G4730 Sleep apnea, unspecified: Secondary | ICD-10-CM

## 2011-09-12 NOTE — Progress Notes (Signed)
Patient Name: Tyler Ramsey Date of Birth: [birth year] Age: 55 y.o. Medical Record Number: [id number] Gender: male  History of Present Illness:  Tyler Ramsey is a 55 y.o. very pleasant male patient who presents with the following:  F/u morbid obesity: 5 pound weight loss. Also saw Dr. Smitty Cords.  Walking 3 times a week, 4-5 times a week Diet: cut out most fats, decreased sodium. More fruits and vegetables. More [place] based. He has stopped all smacking. He states that he has lost greater than 5 pounds, and we waited today with his clothing. He is able to walk, and exercise without pain, and is doing well status post joint replacement.  Sleep apnea, long-standing, the patient does need a new CPAP mask and asked for this prescription.  Dr. Smitty Cords: [phone]  Patient Active Problem List  Diagnoses  . DIABETES MELLITUS, TYPE II  . HYPERLIPIDEMIA  . DEPRESSION  . HYPERTENSION  . ALLERGIC RHINITIS  . SLEEP APNEA  . FATIGUE  . LIVER FUNCTION TESTS, ABNORMAL, HX OF  . Routine general medical examination at a health care facility  . Dysuria  . Obesity, Class III, BMI 40-49.9 (morbid obesity)   Past Medical History  Diagnosis Date  . Diabetes mellitus   . Hyperlipidemia   . Hypertension   . Depression   . Allergy     allergic rhinitis  . Sleep apnea    Past Surgical History  Procedure Date  . Tonsillectomy 1969  . 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 Knee arthroscopy w/ acl reconstruction and hamstring graft 1985   History  Substance Use Topics  . Smoking status: Never Smoker   . Smokeless tobacco: Not on file  . Alcohol Use: No   Family History  Problem Relation Age of Onset  . Emphysema Father   . Heart disease Mother    Allergies  Allergen Reactions  . Sulfonamide Derivatives    Current Outpatient Prescriptions on File Prior to Visit  Medication Sig Dispense Refill  . aspirin 81 MG tablet Take 81 mg by mouth daily.        . benazepril  (LOTENSIN) 20 MG tablet TAKE ONE TABLET BY MOUTH EVERY DAY  90 tablet  3  . carvedilol (COREG) 3.125 MG tablet TAKE ONE TABLET BY MOUTH TWICE DAILY  60 tablet  6  . citalopram (CELEXA) 40 MG tablet Take 1 tablet (40 mg total) by mouth daily.  90 tablet  3  . hydrochlorothiazide 25 MG tablet Take 25 mg by mouth daily.        . metFORMIN (GLUCOPHAGE) 500 MG tablet Take 500 mg by mouth 2 (two) times daily with a meal.        . silodosin (RAPAFLO) 8 MG CAPS capsule Take 8 mg by mouth daily with breakfast.        . simvastatin (ZOCOR) 10 MG tablet TAKE ONE TABLET BY MOUTH AT BEDTIME  90 tablet  3  . Testosterone (ANDROGEL PUMP) 20.25 MG/ACT (1.62%) GEL Place onto the skin. 4 pumps a day          Review of Systems:  GEN: No acute illnesses, no fevers, chills. GI: No n/v/d, eating normally Pulm: No SOB Interactive and getting along well at home.  Otherwise, ROS is as per the HPI.   Physical Examination: Filed Vitals:   09/12/11 0808  BP: 120/70  Pulse: 84  Temp: 98.6 F (37 C)  TempSrc: Oral  Height: 5' 9.5" (1.765 m)  Weight: 285 lb (129.275 kg)  SpO2: 98%     GEN: WDWN, NAD, Non-toxic, A & O x 3 HEENT: Atraumatic, Normocephalic. Neck supple. No masses, No LAD. Ears and Nose: No external deformity. CV: RRR, No M/G/R. No JVD. No thrill. No extra heart sounds. PULM: CTA B, no wheezes, crackles, rhonchi. No retractions. No resp. distress. No accessory muscle use. EXTR: No c/c/e NEURO Normal gait.  PSYCH: Normally interactive. Conversant. Not depressed or anxious appearing.  Calm demeanor.    Assessment and Plan: 1. Obesity, Class III, BMI 40-49.9 (morbid obesity)   2. SLEEP APNEA     Doing well, making some progress with weight loss. He has been cutting his calories and portion size, and he has started exercising. We will follow him monthly, and I will forward this note to his bariatric surgeon Dr. Smitty Cords.  Prescription written for a new CPAP mask  Cc: Dr. Smitty Cords

## 2011-09-12 NOTE — Patient Instructions (Signed)
F/u 1 month 

## 2011-10-06 ENCOUNTER — Ambulatory Visit: Payer: Self-pay | Admitting: Specialist

## 2011-10-09 ENCOUNTER — Encounter: Payer: Self-pay | Admitting: Family Medicine

## 2011-10-09 ENCOUNTER — Ambulatory Visit (INDEPENDENT_AMBULATORY_CARE_PROVIDER_SITE_OTHER): Payer: BC Managed Care – HMO | Admitting: Family Medicine

## 2011-10-09 NOTE — Progress Notes (Signed)
Patient Name: Tyler Ramsey Date of Birth: [birth year] Medical Record Number: [id number] Gender: male  History of Present Illness:  Tyler Ramsey is a 55 y.o. very pleasant male patient who presents with the following:  Wt Readings from Last 3 Encounters:  10/09/11 282 lb 1.9 oz (127.969 kg)  09/12/11 285 lb (129.275 kg)  08/09/11 290 lb 6.4 oz (131.725 kg)    F/u morbid obesity: 8 pound weight loss. Seeing Dr. Smitty Cords, bariatric surgeon. Walking 3-4 times a week, also some elliptical and some bike. About 30 mins Diet: cut out most fats, decreased sodium. More fruits and vegetables. More [place] based. He has stopped all snacking.  He is able to walk, and exercise without pain, and is doing well status post joint replacement.  Patient Active Problem List  Diagnoses  . DIABETES MELLITUS, TYPE II  . HYPERLIPIDEMIA  . DEPRESSION  . HYPERTENSION  . ALLERGIC RHINITIS  . SLEEP APNEA  . FATIGUE  . LIVER FUNCTION TESTS, ABNORMAL, HX OF  . Routine general medical examination at a health care facility  . Dysuria  . Obesity, Class III, BMI 40-49.9 (morbid obesity)   Past Medical History  Diagnosis Date  . Diabetes mellitus   . Hyperlipidemia   . Hypertension   . Depression   . Allergy     allergic rhinitis  . Sleep apnea    Past Surgical History  Procedure Date  . Tonsillectomy 1969  . 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 Knee arthroscopy w/ acl reconstruction and hamstring graft 1985   History  Substance Use Topics  . Smoking status: Never Smoker   . Smokeless tobacco: Not on file  . Alcohol Use: No   Family History  Problem Relation Age of Onset  . Emphysema Father   . Heart disease Mother    Allergies  Allergen Reactions  . Sulfonamide Derivatives    Current Outpatient Prescriptions on File Prior to Visit  Medication Sig Dispense Refill  . aspirin 81 MG tablet Take 81 mg by mouth daily.        . benazepril (LOTENSIN) 20 MG tablet TAKE  ONE TABLET BY MOUTH EVERY DAY  90 tablet  3  . carvedilol (COREG) 3.125 MG tablet TAKE ONE TABLET BY MOUTH TWICE DAILY  60 tablet  6  . citalopram (CELEXA) 40 MG tablet Take 1 tablet (40 mg total) by mouth daily.  90 tablet  3  . hydrochlorothiazide 25 MG tablet Take 25 mg by mouth daily.        . metFORMIN (GLUCOPHAGE) 500 MG tablet Take 500 mg by mouth 2 (two) times daily with a meal.        . silodosin (RAPAFLO) 8 MG CAPS capsule Take 8 mg by mouth daily with breakfast.        . simvastatin (ZOCOR) 10 MG tablet TAKE ONE TABLET BY MOUTH AT BEDTIME  90 tablet  3  . Testosterone (ANDROGEL PUMP) 20.25 MG/ACT (1.62%) GEL Place onto the skin. 4 pumps a day         Review of Systems:  GEN: No acute illnesses, no fevers, chills. GI: No n/v/d, eating normally Pulm: No SOB Interactive and getting along well at home. He does have a small toe blister that is causing him some pain. Present for 1 week.  Otherwise, ROS is as per the HPI.   Physical Examination: Filed Vitals:   10/09/11 0804  BP: 116/70  Pulse: 76  Temp: 98 F (  36.7 C)  TempSrc: Oral  Height: 5' 9.5" (1.765 m)  Weight: 282 lb 1.9 oz (127.969 kg)  SpO2: 98%    Body mass index is 41.06 kg/(m^2).   GEN: WDWN, NAD, Non-toxic, A & O x 3 HEENT: Atraumatic, Normocephalic. Neck supple. No masses, No LAD. Ears and Nose: No external deformity. CV: RRR, No M/G/R. No JVD. No thrill. No extra heart sounds. PULM: CTA B, no wheezes, crackles, rhonchi. No retractions. No resp. distress. No accessory muscle use. EXTR: No c/c/e NEURO Normal gait.  PSYCH: Normally interactive. Conversant. Not depressed or anxious appearing.  Calm demeanor.   Skin: small 5th toe blister, no exterior flap.  Assessment and Plan: 1. Obesity, Class III, BMI 40-49.9 (morbid obesity)     Making good progress.  All forms completed and signed.  Recheck progress in 1 month.

## 2011-10-19 ENCOUNTER — Ambulatory Visit: Payer: Self-pay | Admitting: Specialist

## 2011-10-24 ENCOUNTER — Ambulatory Visit: Payer: Self-pay | Admitting: Specialist

## 2011-11-08 ENCOUNTER — Ambulatory Visit (INDEPENDENT_AMBULATORY_CARE_PROVIDER_SITE_OTHER): Payer: BC Managed Care – HMO | Admitting: Family Medicine

## 2011-11-08 ENCOUNTER — Encounter: Payer: Self-pay | Admitting: Family Medicine

## 2011-11-08 DIAGNOSIS — E66813 Obesity, class 3: Secondary | ICD-10-CM

## 2011-11-08 DIAGNOSIS — I10 Essential (primary) hypertension: Secondary | ICD-10-CM

## 2011-11-08 DIAGNOSIS — G4730 Sleep apnea, unspecified: Secondary | ICD-10-CM

## 2011-11-08 DIAGNOSIS — Z6841 Body Mass Index (BMI) 40.0 and over, adult: Secondary | ICD-10-CM

## 2011-11-08 DIAGNOSIS — E119 Type 2 diabetes mellitus without complications: Secondary | ICD-10-CM

## 2011-11-08 DIAGNOSIS — E785 Hyperlipidemia, unspecified: Secondary | ICD-10-CM

## 2011-11-08 NOTE — Progress Notes (Signed)
Patient Name: Tyler Ramsey Date of Birth: [birth year] Age: 56 y.o. Medical Record Number: [id number] Gender: male Date of Encounter: 11/08/2011  History of Present Illness:  Tyler Ramsey is a 56 y.o. very pleasant male patient who presents with the following:  Wt Readings from Last 3 Encounters:  11/08/11 278 lb 6.4 oz (126.281 kg)  10/09/11 282 lb 1.9 oz (127.969 kg)  09/12/11 285 lb (129.275 kg)   Body mass index is 40.52 kg/(m^2).  F/u morbid obesity: Seeing Dr. Smitty Cords Increased now to walking and biking at least 5 times a week for at least 30 mins Some weights with machines Diet: essentially stopped all snacking, sugars, and has been very compliant. More fruits, veggies, and lean meats.  Patient Active Problem List  Diagnoses  . DIABETES MELLITUS, TYPE II  . HYPERLIPIDEMIA  . DEPRESSION  . HYPERTENSION  . ALLERGIC RHINITIS  . SLEEP APNEA  . FATIGUE  . LIVER FUNCTION TESTS, ABNORMAL, HX OF  . Obesity, Class III, BMI 40-49.9 (morbid obesity)   Past Medical History  Diagnosis Date  . Diabetes mellitus   . Hyperlipidemia   . Hypertension   . Depression   . Allergy     allergic rhinitis  . Sleep apnea    Past Surgical History  Procedure Date  . Tonsillectomy 1969  . 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 Knee arthroscopy w/ acl reconstruction and hamstring graft 1985   History  Substance Use Topics  . Smoking status: Never Smoker   . Smokeless tobacco: Not on file  . Alcohol Use: No   Family History  Problem Relation Age of Onset  . Emphysema Father   . Heart disease Mother    Allergies  Allergen Reactions  . Sulfonamide Derivatives    Current Outpatient Prescriptions on File Prior to Visit  Medication Sig Dispense Refill  . aspirin 81 MG tablet Take 81 mg by mouth daily.        . benazepril (LOTENSIN) 20 MG tablet TAKE ONE TABLET BY MOUTH EVERY DAY  90 tablet  3  . carvedilol (COREG) 3.125 MG tablet TAKE ONE TABLET BY MOUTH  TWICE DAILY  60 tablet  6  . citalopram (CELEXA) 40 MG tablet Take 1 tablet (40 mg total) by mouth daily.  90 tablet  3  . hydrochlorothiazide 25 MG tablet Take 25 mg by mouth daily.        . metFORMIN (GLUCOPHAGE) 500 MG tablet Take 500 mg by mouth 2 (two) times daily with a meal.        . silodosin (RAPAFLO) 8 MG CAPS capsule Take 8 mg by mouth daily with breakfast.        . simvastatin (ZOCOR) 10 MG tablet TAKE ONE TABLET BY MOUTH AT BEDTIME  90 tablet  3  . Testosterone (ANDROGEL PUMP) 20.25 MG/ACT (1.62%) GEL Place onto the skin. 4 pumps a day          Past Medical History, Surgical History, Social History, Family History, Problem List, Medications, and Allergies have been reviewed and updated if relevant.  Review of Systems: No c/o. Feels well. No recent illness  Physical Examination: Filed Vitals:   11/08/11 0820  BP: 120/78  Pulse: 66  Temp: 98 F (36.7 C)  TempSrc: Oral  Height: 5' 9.5" (1.765 m)  Weight: 278 lb 6.4 oz (126.281 kg)  SpO2: 100%    Body mass index is 40.52 kg/(m^2).   GEN: WDWN, NAD, Non-toxic, A &  O x 3 HEENT: Atraumatic, Normocephalic. Neck supple. No masses, No LAD. Ears and Nose: No external deformity. CV: RRR, No M/G/R. No JVD. No thrill. No extra heart sounds. EXTR: No c/c/e NEURO Normal gait.  PSYCH: Normally interactive. Conversant. Not depressed or anxious appearing.  Calm demeanor.    Assessment and Plan: 1. Obesity, Class III, BMI 40-49.9 (morbid obesity)   2. SLEEP APNEA   3. HYPERTENSION   4. HYPERLIPIDEMIA   5. DIABETES MELLITUS, TYPE II     F/u morbid obesity, ongoing 6 month eval for bariatric surgery. Doing very well. Has been to nutrionist and is highly compliant with diet and exercise.  Forms completed.

## 2011-11-08 NOTE — Patient Instructions (Signed)
F/u 4 weeks

## 2011-11-23 ENCOUNTER — Other Ambulatory Visit: Payer: Self-pay | Admitting: Family Medicine

## 2011-12-06 ENCOUNTER — Other Ambulatory Visit: Payer: Self-pay | Admitting: Family Medicine

## 2011-12-07 ENCOUNTER — Encounter: Payer: Self-pay | Admitting: Family Medicine

## 2011-12-07 ENCOUNTER — Ambulatory Visit (INDEPENDENT_AMBULATORY_CARE_PROVIDER_SITE_OTHER): Payer: BC Managed Care – HMO | Admitting: Family Medicine

## 2011-12-07 DIAGNOSIS — N529 Male erectile dysfunction, unspecified: Secondary | ICD-10-CM

## 2011-12-07 DIAGNOSIS — R5381 Other malaise: Secondary | ICD-10-CM

## 2011-12-07 DIAGNOSIS — E291 Testicular hypofunction: Secondary | ICD-10-CM

## 2011-12-07 DIAGNOSIS — R5383 Other fatigue: Secondary | ICD-10-CM

## 2011-12-07 NOTE — Progress Notes (Signed)
  Patient Name: Tyler Ramsey Date of Birth: 08/23/1956 Medical Record Number: [id number] Gender: male Date of Encounter: 12/07/2011  History of Present Illness:  Tyler Ramsey is a 56 y.o. very pleasant male patient who presents with the following:  Wt Readings from Last 3 Encounters:  12/07/11 268 lb (121.564 kg)  11/08/11 278 lb 6.4 oz (126.281 kg)  10/09/11 282 lb 1.9 oz (127.969 kg)   Body mass index is 39.01 kg/(m^2).  Continues to do well, compliant with diet. Low fat, lean meats. More fruits and veggies. 22 pounds weight loss.  Exercising 5-6 days a week. See scanned forms. Optimistic.  Hypogonadal: on 4 pumps of androgel right now. Fatigue improved, erectile quality improved while on this. Dr. Artis Flock has been following this.   Patient Active Problem List  Diagnoses  . DIABETES MELLITUS, TYPE II  . HYPERLIPIDEMIA  . DEPRESSION  . HYPERTENSION  . ALLERGIC RHINITIS  . SLEEP APNEA  . FATIGUE  . LIVER FUNCTION TESTS, ABNORMAL, HX OF  . Obesity, Class III, BMI 40-49.9 (morbid obesity)   Past Medical History  Diagnosis Date  . Diabetes mellitus   . Hyperlipidemia   . Hypertension   . Depression   . Allergy     allergic rhinitis  . Sleep apnea    Past Surgical History  Procedure Date  . Tonsillectomy 1969  . 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 Knee arthroscopy w/ acl reconstruction and hamstring graft 1985   History  Substance Use Topics  . Smoking status: Never Smoker   . Smokeless tobacco: Not on file  . Alcohol Use: No   Family History  Problem Relation Age of Onset  . Emphysema Father   . Heart disease Mother    Allergies  Allergen Reactions  . Sulfonamide Derivatives     Medication list has been reviewed and updated.  Review of Systems:  GEN: No acute illnesses, no fevers, chills. GI: No n/v/d, eating normally Pulm: No SOB Interactive and getting along well at home.  Otherwise, ROS is as per the HPI.   Physical  Examination: Filed Vitals:   12/07/11 0805  BP: 120/84  Pulse: 81  Temp: 97.6 F (36.4 C)  TempSrc: Oral  Height: 5' 9.5" (1.765 m)  Weight: 268 lb (121.564 kg)  SpO2: 98%    Body mass index is 39.01 kg/(m^2).   GEN: WDWN, NAD, Non-toxic, A & O x 3 HEENT: Atraumatic, Normocephalic. Neck supple. No masses, No LAD. Ears and Nose: No external deformity. CV: RRR, No M/G/R. No JVD. No thrill. No extra heart sounds. PULM: CTA B, no wheezes, crackles, rhonchi. No retractions. No resp. distress. No accessory muscle use. EXTR: No c/c/e NEURO Normal gait.  PSYCH: Normally interactive. Conversant. Not depressed or anxious appearing.  Calm demeanor.    Assessment and Plan: 1. Hypogonadism male  Testosterone, free, total  2. Erectile dysfunction  Testosterone, free, total  3. Fatigue  Testosterone, free, total  4. Morbid obesity      All forms completed for bariatric surgery. Doing great.  Discussed hypogonadism. I am OK following him for this myself, keeping his testosterone level normal for his age. Discussed risks and benefits.

## 2011-12-08 LAB — TESTOSTERONE, FREE, TOTAL, SHBG
Sex Hormone Binding: 16 nmol/L (ref 13–71)
Testosterone, Free: 41.3 pg/mL — ABNORMAL LOW (ref 47.0–244.0)
Testosterone-% Free: 2.7 % (ref 1.6–2.9)
Testosterone: 154.53 ng/dL — ABNORMAL LOW (ref 250–890)

## 2012-01-08 ENCOUNTER — Encounter: Payer: Self-pay | Admitting: Family Medicine

## 2012-01-08 ENCOUNTER — Ambulatory Visit (INDEPENDENT_AMBULATORY_CARE_PROVIDER_SITE_OTHER): Payer: BC Managed Care – HMO | Admitting: Family Medicine

## 2012-01-08 VITALS — BP 120/78 | HR 81 | Temp 97.9°F | Ht 69.5 in | Wt 268.8 lb

## 2012-01-08 DIAGNOSIS — I10 Essential (primary) hypertension: Secondary | ICD-10-CM

## 2012-01-08 DIAGNOSIS — G4730 Sleep apnea, unspecified: Secondary | ICD-10-CM

## 2012-01-08 DIAGNOSIS — E119 Type 2 diabetes mellitus without complications: Secondary | ICD-10-CM

## 2012-01-08 DIAGNOSIS — E785 Hyperlipidemia, unspecified: Secondary | ICD-10-CM

## 2012-01-08 NOTE — Progress Notes (Signed)
  Patient Name: Tyler Ramsey Date of Birth: [birth year] Age: 56 y.o. Medical Record Number: [id number] Gender: male Date of Encounter: 01/08/2012  History of Present Illness:  Tyler Ramsey is a 56 y.o. very pleasant male patient who presents with the following:  Here for medical weight loss f/u visit # 6 over six months with a 21.5 pound weight loss over this time period. He has been compliant decreasing his caloric intake down to 2000 calories a day down from 3000 calories a day. He continues to be active, walking 30 minutes a day, with some slightly less active time during a business trip to [place].  Wt Readings from Last 3 Encounters:  01/08/12 268 lb 12.8 oz (121.927 kg)  12/07/11 268 lb (121.564 kg)  11/08/11 278 lb 6.4 oz (126.281 kg)   Body mass index is 39.13 kg/(m^2).  Past Medical History, Surgical History, Social History, Family History, Problem List, Medications, and Allergies have been reviewed and updated if relevant.  Review of Systems: As above. O/w with some fatigue. No sob with walking.  Physical Examination: Filed Vitals:   01/08/12 0805  BP: 120/78  Pulse: 81  Temp: 97.9 F (36.6 C)  TempSrc: Oral  Height: 5' 9.5" (1.765 m)  Weight: 268 lb 12.8 oz (121.927 kg)  SpO2: 99%    Body mass index is 39.13 kg/(m^2).   GEN: WDWN, NAD, Non-toxic, A & O x 3 HEENT: Atraumatic, Normocephalic. Neck supple. No masses, No LAD. Ears and Nose: No external deformity. CV: RRR, No M/G/R. No JVD. No thrill. No extra heart sounds. PULM: CTA B, no wheezes, crackles, rhonchi. No retractions. No resp. distress. No accessory muscle use. EXTR: No c/c/e NEURO Normal gait.  PSYCH: Normally interactive. Conversant. Not depressed or anxious appearing.  Calm demeanor.    Assessment and Plan: 1. Obesity, Class III, BMI 40-49.9 (morbid obesity)   2. DIABETES MELLITUS, TYPE II   3. HYPERLIPIDEMIA   4. HYPERTENSION   5. SLEEP APNEA     Forms completed, 6 months of  supervised medical weight loss therapy prior to bariatric surgery has been done. He is very motivated.  We also discussed his androgel. Last free T 43 -- unsure if he going to continue. May titrate off over the next few weeks. Suggested down to 1 pump for 3 weeks, then stop.

## 2012-01-09 ENCOUNTER — Other Ambulatory Visit: Payer: Self-pay | Admitting: *Deleted

## 2012-01-09 MED ORDER — TESTOSTERONE 20.25 MG/ACT (1.62%) TD GEL: 4.0000 "application " | Freq: Every day | TRANSDERMAL | Status: DC

## 2012-01-09 NOTE — Telephone Encounter (Signed)
rx called to [facility] [address]

## 2012-01-09 NOTE — Telephone Encounter (Signed)
Patient called requesting a refill of Androgel 1.62%.  He stated that this Rx was originally written by Dr. Sheppard Penton.  Please advise.

## 2012-01-09 NOTE — Telephone Encounter (Signed)
Ok to refill 1 pump as above (1.62%), 0 refills

## 2012-02-02 ENCOUNTER — Ambulatory Visit: Payer: Self-pay | Admitting: Specialist

## 2012-02-02 LAB — POTASSIUM: Potassium: 3.7 mmol/L

## 2012-02-13 ENCOUNTER — Inpatient Hospital Stay: Payer: Self-pay | Admitting: Specialist

## 2012-02-14 LAB — BASIC METABOLIC PANEL
Anion Gap: 8 (ref 7–16)
BUN: 10 mg/dL (ref 7–18)
Calcium, Total: 8.3 mg/dL — ABNORMAL LOW (ref 8.5–10.1)
Chloride: 102 mmol/L (ref 98–107)
Co2: 26 mmol/L (ref 21–32)
Creatinine: 1.46 mg/dL — ABNORMAL HIGH (ref 0.60–1.30)
EGFR (African American): >60
EGFR (Non-African Amer.): 53 — ABNORMAL LOW
Glucose: 124 mg/dL — ABNORMAL HIGH (ref 65–99)
Osmolality: 272 (ref 275–301)
Potassium: 4.6 mmol/L (ref 3.5–5.1)
Sodium: 136 mmol/L (ref 136–145)

## 2012-02-14 LAB — CBC WITH DIFFERENTIAL/PLATELET
Basophil #: 0 10*3/uL (ref 0.0–0.1)
Basophil %: 0.2 %
Eosinophil #: 0 10*3/uL (ref 0.0–0.7)
Eosinophil %: 0 %
HCT: 48.4 % (ref 40.0–52.0)
HGB: 16.3 g/dL (ref 13.0–18.0)
Lymphocyte #: 0.5 10*3/uL — ABNORMAL LOW (ref 1.0–3.6)
Lymphocyte %: 7.9 %
MCH: 31.3 pg (ref 26.0–34.0)
MCHC: 33.8 g/dL (ref 32.0–36.0)
MCV: 93 fL (ref 80–100)
Monocyte #: 0.2 x10 3/mm (ref 0.2–1.0)
Monocyte %: 2.3 %
Neutrophil #: 6.1 10*3/uL (ref 1.4–6.5)
Neutrophil %: 89.6 %
Platelet: 127 10*3/uL — ABNORMAL LOW (ref 150–440)
RBC: 5.23 10*6/uL (ref 4.40–5.90)
RDW: 12.9 % (ref 11.5–14.5)
WBC: 6.8 10*3/uL (ref 3.8–10.6)

## 2012-02-14 LAB — ALBUMIN: Albumin: 4 g/dL (ref 3.4–5.0)

## 2012-02-14 LAB — PHOSPHORUS: Phosphorus: 3.8 mg/dL (ref 2.5–4.9)

## 2012-02-14 LAB — MAGNESIUM: Magnesium: 2.1 mg/dL

## 2012-02-15 LAB — PATHOLOGY REPORT

## 2012-02-28 ENCOUNTER — Ambulatory Visit: Payer: Self-pay | Admitting: Specialist

## 2012-03-07 ENCOUNTER — Other Ambulatory Visit: Payer: Self-pay | Admitting: Family Medicine

## 2012-03-08 ENCOUNTER — Other Ambulatory Visit: Payer: Self-pay | Admitting: *Deleted

## 2012-03-09 NOTE — Telephone Encounter (Signed)
Refill as written, disp 75 grams, 5 refills

## 2012-03-11 MED ORDER — TESTOSTERONE 20.25 MG/ACT (1.62%) TD GEL: 4.0000 "application " | Freq: Every day | TRANSDERMAL | Status: DC

## 2012-03-11 NOTE — Telephone Encounter (Signed)
rx printed [facility]

## 2012-03-23 ENCOUNTER — Ambulatory Visit: Payer: Self-pay | Admitting: Specialist

## 2012-06-03 ENCOUNTER — Encounter: Payer: Self-pay | Admitting: Family Medicine

## 2012-06-03 ENCOUNTER — Encounter: Payer: Self-pay | Admitting: *Deleted

## 2012-06-03 ENCOUNTER — Ambulatory Visit (INDEPENDENT_AMBULATORY_CARE_PROVIDER_SITE_OTHER): Payer: BC Managed Care – HMO | Admitting: Family Medicine

## 2012-06-03 VITALS — BP 124/76 | HR 80 | Temp 98.6°F | Ht 69.5 in | Wt 206.0 lb

## 2012-06-03 DIAGNOSIS — E785 Hyperlipidemia, unspecified: Secondary | ICD-10-CM

## 2012-06-03 DIAGNOSIS — Z79899 Other long term (current) drug therapy: Secondary | ICD-10-CM

## 2012-06-03 DIAGNOSIS — E119 Type 2 diabetes mellitus without complications: Secondary | ICD-10-CM

## 2012-06-03 LAB — BASIC METABOLIC PANEL
BUN: 13 mg/dL (ref 6–23)
CO2: 31 mEq/L (ref 19–32)
Calcium: 9.3 mg/dL (ref 8.4–10.5)
Chloride: 100 mEq/L (ref 96–112)
Creatinine, Ser: 1.1 mg/dL (ref 0.4–1.5)
GFR: 73.3 mL/min (ref >60.00)
Glucose, Bld: 93 mg/dL (ref 70–99)
Potassium: 4.1 mEq/L (ref 3.5–5.1)
Sodium: 139 mEq/L (ref 135–145)

## 2012-06-03 LAB — LIPID PANEL
Cholesterol: 177 mg/dL (ref 0–200)
HDL: 44 mg/dL (ref >39.00)
LDL Cholesterol: 114 mg/dL — ABNORMAL HIGH (ref 0–99)
Total CHOL/HDL Ratio: 4
Triglycerides: 94 mg/dL (ref 0.0–149.0)
VLDL: 18.8 mg/dL (ref 0.0–40.0)

## 2012-06-03 LAB — HEMOGLOBIN A1C: Hgb A1c MFr Bld: 5.1 % (ref 4.6–6.5)

## 2012-06-03 MED ORDER — HYDROCHLOROTHIAZIDE 25 MG PO TABS: 12.5000 mg | ORAL_TABLET | Freq: Every day | ORAL | Status: DC

## 2012-06-03 MED ORDER — CITALOPRAM HYDROBROMIDE 40 MG PO TABS: 20.0000 mg | ORAL_TABLET | Freq: Every day | ORAL | Status: DC

## 2012-06-03 NOTE — Progress Notes (Signed)
[facility] at [facility] [address] [place] [address] Phone: [phone] Fax: [phone]  Date:  06/03/2012   Name:  Tyler Ramsey   DOB:  17-Feb-1956   MRN:  [id number] Gender: male  Age: 56 y.o.  PCP:  Hannah Beat, MD    Chief Complaint: Follow-up   History of Present Illness:  Tyler Ramsey is a 56 y.o. pleasant patient who presents with the following:  90 pound weight loss  BS looking good   Diabetes Mellitus: Tolerating Medications: off meds now Compliance with diet: good Exercise: yes Avg blood sugars at home: nml Foot problems: none Hypoglycemia: none No nausea, vomitting, blurred vision, polyuria.  Lab Results  Component Value Date   HGBA1C 7.0* 08/09/2011    Wt Readings from Last 3 Encounters:  06/03/12 206 lb (93.441 kg)  01/08/12 268 lb 12.8 oz (121.927 kg)  12/07/11 268 lb (121.564 kg)    Body mass index is 29.98 kg/(m^2).   HTN: Tolerating all medications without side effects - decreased to only HCZ 12.5 mg Stable and at goal No CP, no sob. No HA.  BP Readings from Last 3 Encounters:  06/03/12 124/76  01/08/12 120/78  12/07/11 120/84    Basic Metabolic Panel:    Component Value Date/Time   NA 139 02/03/2011 0901   K 4.8 02/03/2011 0901   CL 101 02/03/2011 0901   CO2 29 02/03/2011 0901   BUN 13 02/03/2011 0901   CREATININE 0.9 02/03/2011 0901   GLUCOSE 123* 02/03/2011 0901   CALCIUM 9.6 02/03/2011 0901      Patient Active Problem List  Diagnosis  . DIABETES MELLITUS, TYPE II  . HYPERLIPIDEMIA  . DEPRESSION  . HYPERTENSION  . ALLERGIC RHINITIS  . SLEEP APNEA  . FATIGUE  . LIVER FUNCTION TESTS, ABNORMAL, HX OF  . Obesity, Class III, BMI 40-49.9 (morbid obesity)    Past Medical History  Diagnosis Date  . Diabetes mellitus   . Hyperlipidemia   . Hypertension   . Depression   . Allergy     allergic rhinitis  . Sleep apnea     Past Surgical History  Procedure Date  . Tonsillectomy 1969  . Total  knee arthroplasty 5/11    Left, Dr Carma Lair  . Knee arthroscopy w/ acl reconstruction and hamstring graft 1985    History  Substance Use Topics  . Smoking status: Never Smoker   . Smokeless tobacco: Never Used  . Alcohol Use: No    Family History  Problem Relation Age of Onset  . Emphysema Father   . Heart disease Mother     Allergies  Allergen Reactions  . Sulfonamide Derivatives   . Morphine And Related Rash    Medication list has been reviewed and updated.  Current Outpatient Prescriptions on File Prior to Visit  Medication Sig Dispense Refill  . Armodafinil (NUVIGIL) 250 MG tablet TAKE 1/4 BY MOUTH DAILY      . aspirin 81 MG tablet Take 81 mg by mouth daily.        . Testosterone 20.25 MG/ACT (1.62%) GEL Place 4 application onto the skin daily.  75 g  5  . DISCONTD: citalopram (CELEXA) 40 MG tablet TAKE ONE TABLET BY MOUTH EVERY DAY  90 tablet  3  . DISCONTD: hydrochlorothiazide (HYDRODIURIL) 25 MG tablet TAKE ONE TABLET BY MOUTH EVERY DAY  90 tablet  3  . benazepril (LOTENSIN) 20 MG tablet TAKE ONE TABLET BY MOUTH EVERY DAY  90  tablet  3  . carvedilol (COREG) 3.125 MG tablet TAKE ONE TABLET BY MOUTH TWICE DAILY  60 tablet  11  . metFORMIN (GLUCOPHAGE) 500 MG tablet TAKE ONE TABLET BY MOUTH TWICE DAILY  180 tablet  3  . silodosin (RAPAFLO) 8 MG CAPS capsule Take 8 mg by mouth daily with breakfast.        . simvastatin (ZOCOR) 10 MG tablet TAKE ONE TABLET BY MOUTH AT BEDTIME  90 tablet  3    Review of Systems:   GEN: No acute illnesses, no fevers, chills. GI: No n/v/d, eating normally Pulm: No SOB Interactive and getting along well at home.  Otherwise, ROS is as per the HPI.   Physical Examination: Filed Vitals:   06/03/12 0804  BP: 124/76  Pulse: 80  Temp: 98.6 F (37 C)   Filed Vitals:   06/03/12 0804  Height: 5' 9.5" (1.765 m)  Weight: 206 lb (93.441 kg)   Body mass index is 29.98 kg/(m^2). Ideal Body Weight: Weight in (lb) to have BMI = 25:  171.4    GEN: WDWN, NAD, Non-toxic, A & O x 3 HEENT: Atraumatic, Normocephalic. Neck supple. No masses, No LAD. Ears and Nose: No external deformity. CV: RRR, No M/G/R. No JVD. No thrill. No extra heart sounds. PULM: CTA B, no wheezes, crackles, rhonchi. No retractions. No resp. distress. No accessory muscle use. EXTR: No c/c/e NEURO Normal gait.  PSYCH: Normally interactive. Conversant. Not depressed or anxious appearing.  Calm demeanor.    Assessment and Plan:  1. Other and unspecified hyperlipidemia  Lipid panel  2. Type II or unspecified type diabetes mellitus without mention of complication, not stated as uncontrolled  Hemoglobin A1c  3. Encounter for long-term (current) use of other medications  Basic metabolic panel   Doing great, check basic labs Cont to work with weight loss  Orders Today:  Orders Placed This Encounter  Procedures  . Lipid panel  . Hemoglobin A1c  . Basic metabolic panel    Medications Today: (Includes new updates added during medication reconciliation) Meds ordered this encounter  Medications  . Armodafinil (NUVIGIL) 250 MG tablet    Sig: TAKE 1/4 BY MOUTH DAILY  . DISCONTD: hydrochlorothiazide (HYDRODIURIL) 25 MG tablet    Sig:   . DISCONTD: citalopram (CELEXA) 40 MG tablet    Sig:   . citalopram (CELEXA) 40 MG tablet    Sig: Take 0.5 tablets (20 mg total) by mouth daily.    Dispense:  30 tablet    Refill:  0  . hydrochlorothiazide (HYDRODIURIL) 25 MG tablet    Sig: Take 0.5 tablets (12.5 mg total) by mouth daily.    Dispense:  30 tablet    Refill:  0    Medications Discontinued: Medications Discontinued During This Encounter  Medication Reason  . citalopram (CELEXA) 40 MG tablet   . hydrochlorothiazide (HYDRODIURIL) 25 MG tablet   . benazepril (LOTENSIN) 20 MG tablet   . carvedilol (COREG) 3.125 MG tablet   . metFORMIN (GLUCOPHAGE) 500 MG tablet   . silodosin (RAPAFLO) 8 MG CAPS capsule   . simvastatin (ZOCOR) 10 MG tablet   .  citalopram (CELEXA) 40 MG tablet Reorder  . hydrochlorothiazide (HYDRODIURIL) 25 MG tablet Reorder    Labs Results from 06/03/2012 that have returned and recent labs: Results for orders placed in visit on 12/07/11  TESTOSTERONE, FREE, TOTAL      Component Value Range   Testosterone 154.53 (*) 250 - 890 ng/dL   Sex  Hormone Binding 16  13 - 71 nmol/L   Testosterone, Free 41.3 (*) 47.0 - 244.0 pg/mL   Testosterone-% Freee. 2.7  1.6 - 2.9 %     Hannah Beat, MD

## 2012-07-09 IMAGING — US ABDOMEN ULTRASOUND LIMITED
1 series · 17 of 25 positions shown · non-contrast
Comparison: none

REASON FOR EXAM: obstructive sleep apnea elevated cholesterol
hypertension diabetes morbid ob...
COMMENTS:

[Series 1: abdomen ultrasound limited · 17 of 41 slices shown]
[im 1/41]
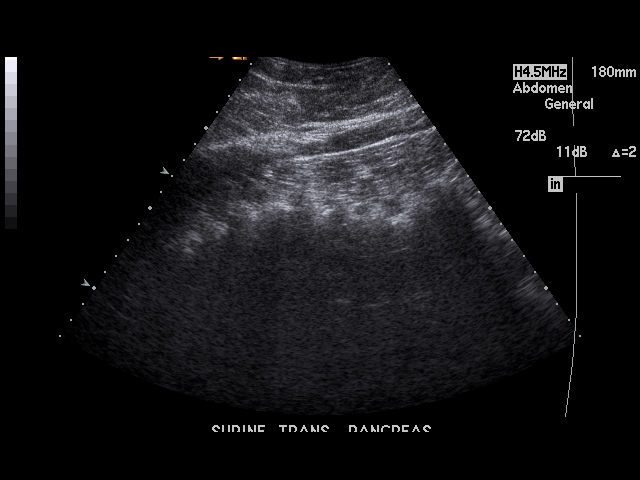
[im 4/41]
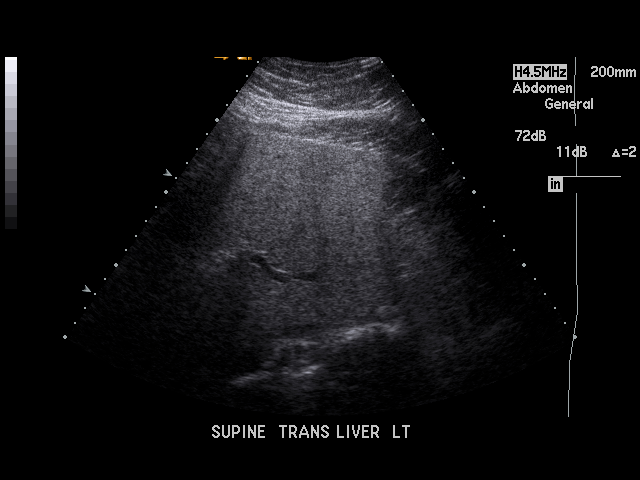
[im 6/41]
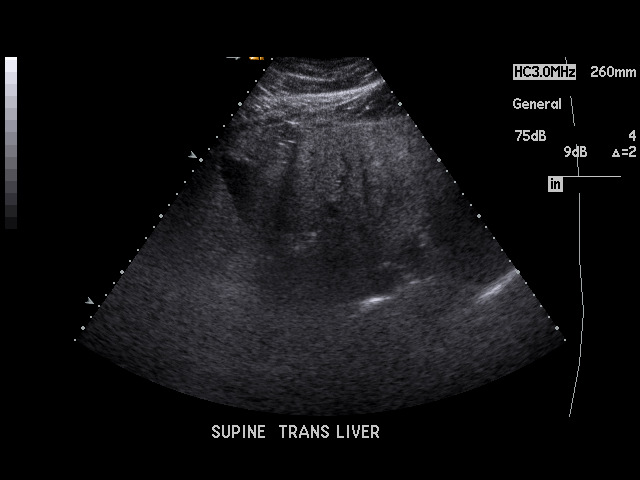
[im 9/41]
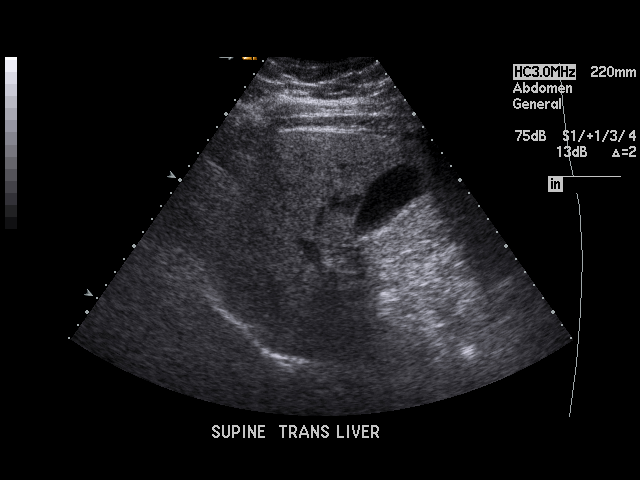
[im 11/41]
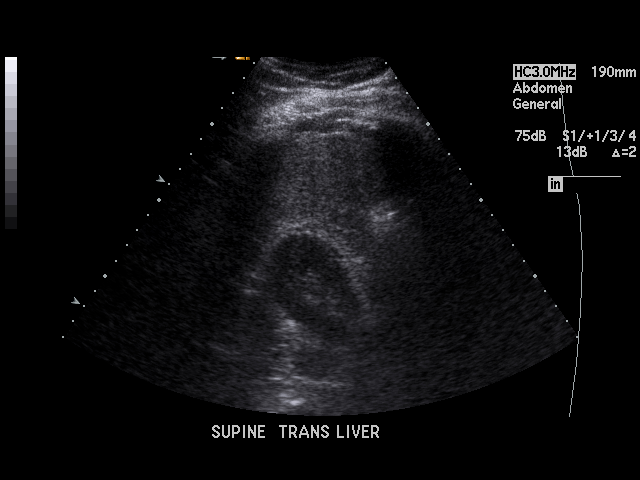
[im 14/41]
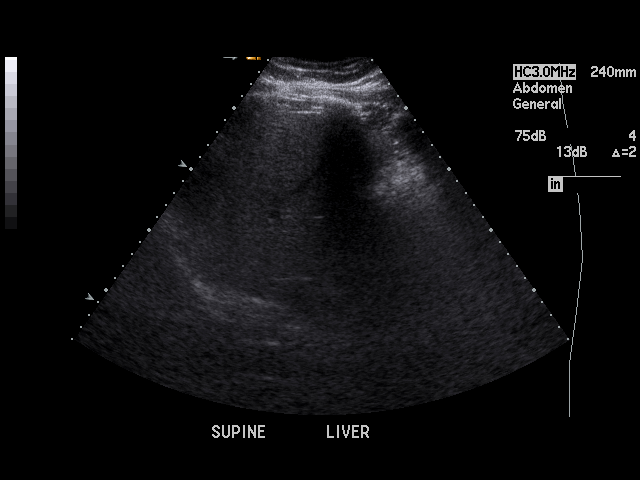
[im 16/41]
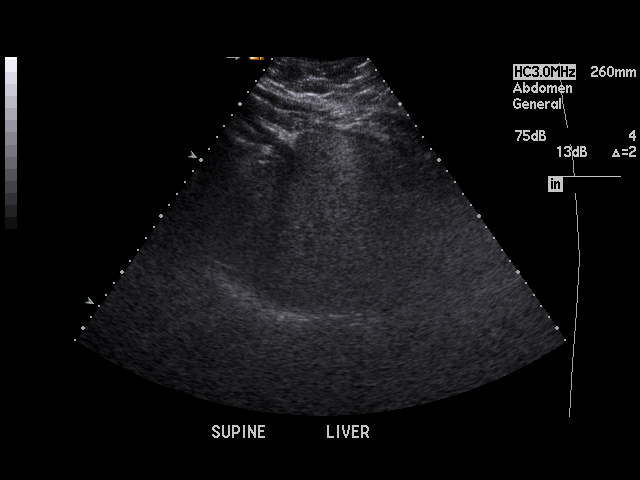
[im 19/41]
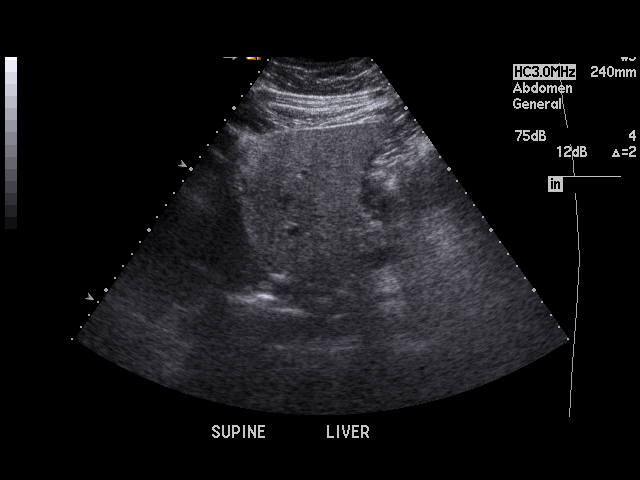
[im 21/41]
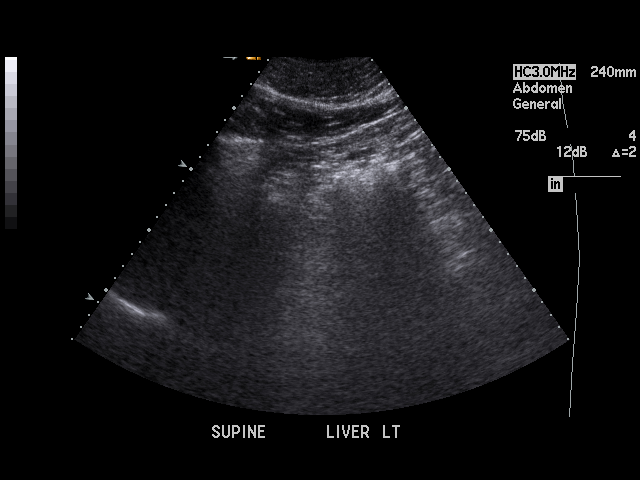
[im 22/41]
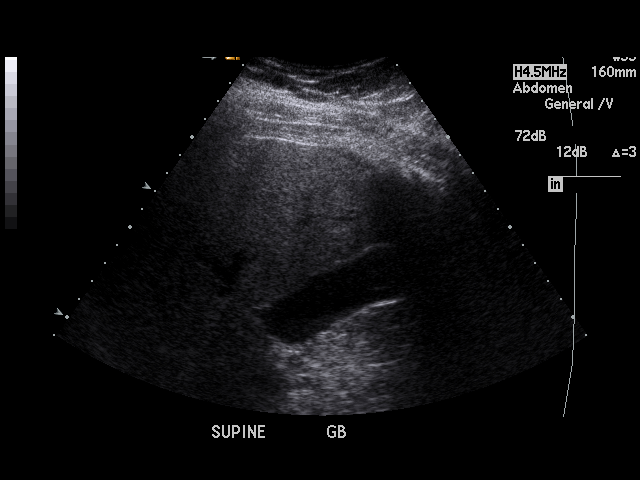
[im 26/41]
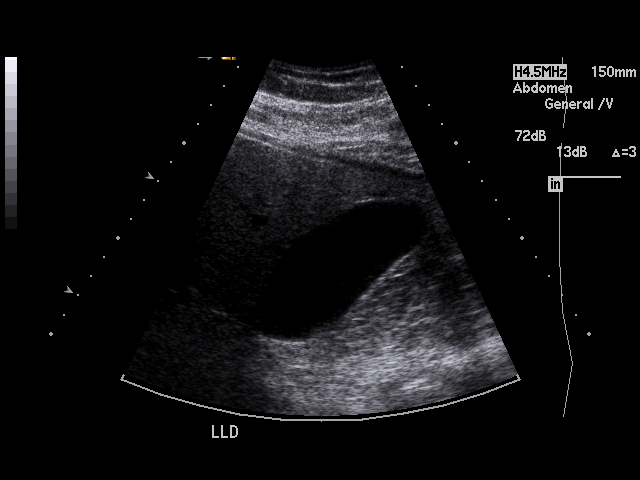
[im 27/41]
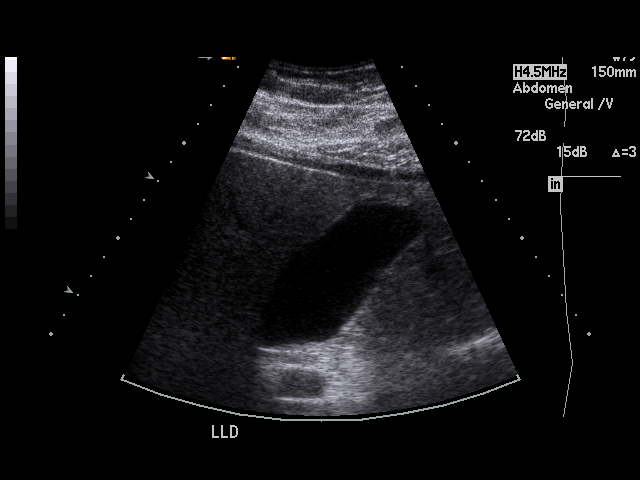
[im 31/41]
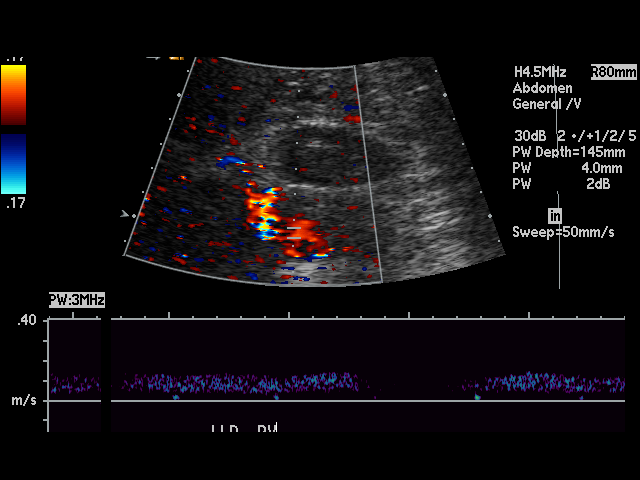
[im 32/41]
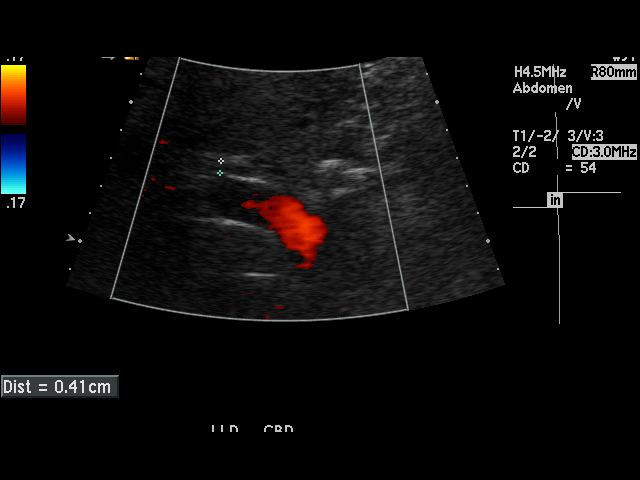
[im 36/41]
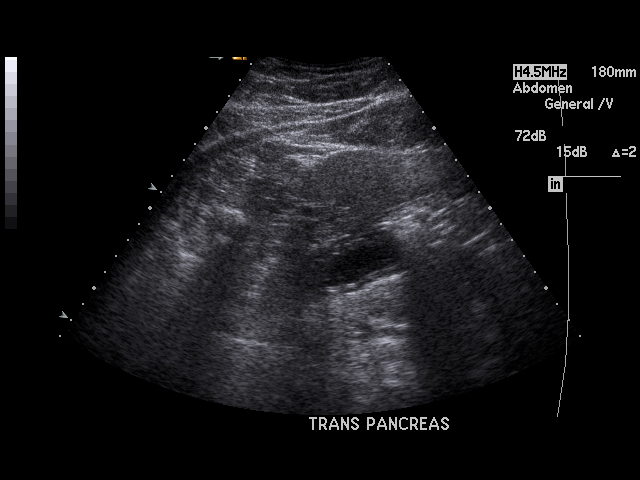
[im 37/41]
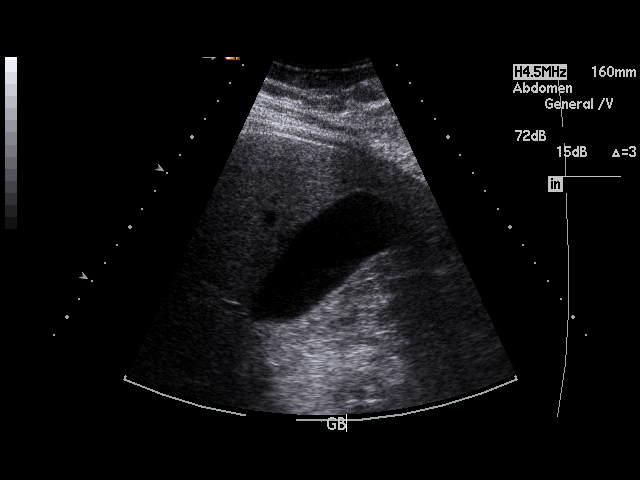
[im 41/41]
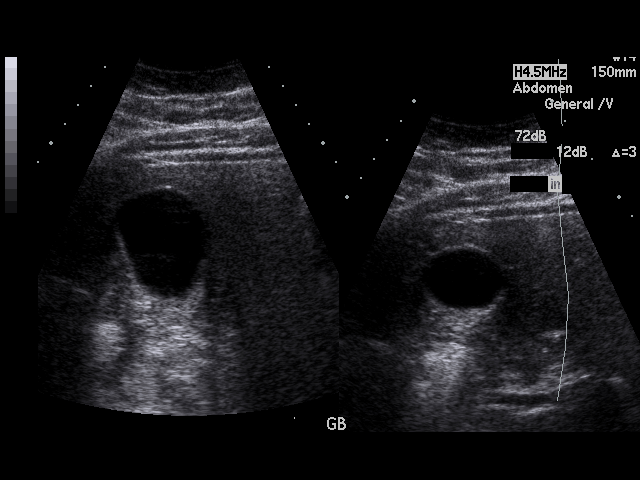

[17 of 25 positions shown; findings below may reference images not displayed]

PROCEDURE:     US  - US ABDOMEN LIMITED SURVEY  - October 06, 2011  [DATE]

RESULT:

Limited Abdominal Sonogram is performed. The hepatic echotexture is
diffusely increased consistent with hepatic steatosis. The pancreas could
not be visualized. No gallstones are evident. Gallbladder wall thickness is
1.8 mm. The common bile duct diameter is 4.8 mm. No ascites or abnormal
fluid collection is seen. The visualized right kidney is unremarkable.
IMPRESSION: 1.  Hepatic steatosis.
2.  Nonvisualization of the pancreas.

## 2012-07-26 ENCOUNTER — Encounter: Payer: Self-pay | Admitting: Family Medicine

## 2012-08-12 ENCOUNTER — Encounter: Payer: Self-pay | Admitting: *Deleted

## 2012-08-12 ENCOUNTER — Ambulatory Visit (INDEPENDENT_AMBULATORY_CARE_PROVIDER_SITE_OTHER): Payer: BC Managed Care – HMO | Admitting: Family Medicine

## 2012-08-12 ENCOUNTER — Encounter: Payer: Self-pay | Admitting: Family Medicine

## 2012-08-12 VITALS — BP 100/60 | HR 60 | Temp 98.1°F | Wt 196.2 lb

## 2012-08-12 DIAGNOSIS — Z8639 Personal history of other endocrine, nutritional and metabolic disease: Secondary | ICD-10-CM

## 2012-08-12 DIAGNOSIS — Z125 Encounter for screening for malignant neoplasm of prostate: Secondary | ICD-10-CM

## 2012-08-12 DIAGNOSIS — G4700 Insomnia, unspecified: Secondary | ICD-10-CM

## 2012-08-12 DIAGNOSIS — Z862 Personal history of diseases of the blood and blood-forming organs and certain disorders involving the immune mechanism: Secondary | ICD-10-CM

## 2012-08-12 LAB — PSA: PSA: 0.42 ng/mL (ref 0.10–4.00)

## 2012-08-12 LAB — HEPATIC FUNCTION PANEL
ALT: 26 U/L (ref 0–53)
AST: 25 U/L (ref 0–37)
Albumin: 4.1 g/dL (ref 3.5–5.2)
Alkaline Phosphatase: 63 U/L (ref 39–117)
Bilirubin, Direct: 0.2 mg/dL (ref 0.0–0.3)
Total Bilirubin: 0.6 mg/dL (ref 0.3–1.2)
Total Protein: 6.9 g/dL (ref 6.0–8.3)

## 2012-08-12 MED ORDER — CITALOPRAM HYDROBROMIDE 40 MG PO TABS: 20.0000 mg | ORAL_TABLET | Freq: Every day | ORAL | Status: DC

## 2012-08-12 MED ORDER — ZOLPIDEM TARTRATE 10 MG PO TABS: 10.0000 mg | ORAL_TABLET | Freq: Every evening | ORAL | Status: DC | PRN

## 2012-08-12 MED ORDER — HYDROCHLOROTHIAZIDE 25 MG PO TABS: 12.5000 mg | ORAL_TABLET | ORAL | Status: DC | PRN

## 2012-08-12 NOTE — Progress Notes (Signed)
[facility] at [facility] [address] [place] [address] Phone: [phone] Fax: [phone]  Date:  08/12/2012   Name:  Tyler Ramsey   DOB:  October 11, 1956   MRN:  [id number] Gender: male Age: 56 y.o.  PCP:  Hannah Beat, MD  Evaluating MD: Hannah Beat, MD   Chief Complaint: Follow-up   History of Present Illness:  Tyler Ramsey is a 56 y.o. pleasant patient who presents with the following:  The patient has been having trouble sleeping time and has been having to use some Ambien intermittently. He has been getting this from the sleep medicine physician.  He also is historically hasn't elevated liver functions tests, and some fatty liver. He lost about 100 pound since his LAP-BAND surgery.  Patient Active Problem List  Diagnosis  . DEPRESSION  . HYPERTENSION  . ALLERGIC RHINITIS  . SLEEP APNEA  . FATIGUE  . LIVER FUNCTION TESTS, ABNORMAL, HX OF    Past Medical History  Diagnosis Date  . Diabetes mellitus   . Hyperlipidemia   . Hypertension   . Depression   . Allergy     allergic rhinitis  . Sleep apnea     Past Surgical History  Procedure Date  . Tonsillectomy 1969  . 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 Knee arthroscopy w/ acl reconstruction and hamstring graft 1985    History  Substance Use Topics  . Smoking status: Never Smoker   . Smokeless tobacco: Never Used  . Alcohol Use: No    Family History  Problem Relation Age of Onset  . Emphysema Father   . Heart disease Mother     Allergies  Allergen Reactions  . Sulfonamide Derivatives   . Morphine And Related Rash    Medication list has been reviewed and updated.  Outpatient Prescriptions Prior to Visit  Medication Sig Dispense Refill  . aspirin 81 MG tablet Take 81 mg by mouth daily.        . Testosterone 20.25 MG/ACT (1.62%) GEL Place 4 application onto the skin daily.  75 g  5  . citalopram (CELEXA) 40 MG tablet Take 0.5 tablets (20 mg  total) by mouth daily.  30 tablet  0  . hydrochlorothiazide (HYDRODIURIL) 25 MG tablet Take 0.5 tablets (12.5 mg total) by mouth daily.  30 tablet  0  . Armodafinil (NUVIGIL) 250 MG tablet TAKE 1/4 BY MOUTH DAILY        Review of Systems:   GEN: No acute illnesses, no fevers, chills. GI: No n/v/d, eating normally Pulm: No SOB Interactive and getting along well at home.  Otherwise, ROS is as per the HPI.   Physical Examination: Filed Vitals:   08/12/12 1014  BP: 100/60  Pulse: 60  Temp: 98.1 F (36.7 C)  TempSrc: Oral  Weight: 196 lb 4 oz (89.018 kg)    There is no height on file to calculate BMI. Ideal Body Weight:     GEN: WDWN, NAD, Non-toxic, A & O x 3 HEENT: Atraumatic, Normocephalic. Neck supple. No masses, No LAD. Ears and Nose: No external deformity. CV: RRR, No M/G/R. No JVD. No thrill. No extra heart sounds. PULM: CTA B, no wheezes, crackles, rhonchi. No retractions. No resp. distress. No accessory muscle use. EXTR: No c/c/e NEURO Normal gait.  PSYCH: Normally interactive. Conversant. Not depressed or anxious appearing.  Calm demeanor.    Assessment and Plan:  1. Insomnia    2. Special screening for  malignant neoplasm of prostate  PSA  3. LIVER FUNCTION TESTS, ABNORMAL, HX OF  Hepatic function panel   Refill all meds ambien prn  Orders Today:  Orders Placed This Encounter  Procedures  . PSA  . Hepatic function panel    Updated Medication List: (Includes new medications, updates to list, dose adjustments) Meds ordered this encounter  Medications  . DISCONTD: hydrochlorothiazide (HYDRODIURIL) 25 MG tablet    Sig: Take 12.5 mg by mouth as needed.  . zolpidem (AMBIEN) 10 MG tablet    Sig: Take 1 tablet (10 mg total) by mouth at bedtime as needed for sleep.    Dispense:  30 tablet    Refill:  3  . citalopram (CELEXA) 40 MG tablet    Sig: Take 0.5 tablets (20 mg total) by mouth daily.    Dispense:  45 tablet    Refill:  3  . hydrochlorothiazide  (HYDRODIURIL) 25 MG tablet    Sig: Take 0.5 tablets (12.5 mg total) by mouth as needed.    Dispense:  90 tablet    Refill:  3    Medications Discontinued: Medications Discontinued During This Encounter  Medication Reason  . Armodafinil (NUVIGIL) 250 MG tablet Patient has not taken in last 30 days  . hydrochlorothiazide (HYDRODIURIL) 25 MG tablet   . citalopram (CELEXA) 40 MG tablet Reorder  . hydrochlorothiazide (HYDRODIURIL) 25 MG tablet Reorder     Hannah Beat, MD

## 2012-08-14 ENCOUNTER — Other Ambulatory Visit: Payer: Self-pay | Admitting: Physician Assistant

## 2012-08-14 LAB — CBC WITH DIFFERENTIAL/PLATELET
Basophil #: 0 10*3/uL (ref 0.0–0.1)
Basophil %: 0.8 %
Eosinophil #: 0.1 10*3/uL (ref 0.0–0.7)
Eosinophil %: 2.4 %
HCT: 50.1 % (ref 40.0–52.0)
HGB: 17.6 g/dL (ref 13.0–18.0)
Lymphocyte #: 1.6 10*3/uL (ref 1.0–3.6)
Lymphocyte %: 29.9 %
MCH: 32.2 pg (ref 26.0–34.0)
MCHC: 35.2 g/dL (ref 32.0–36.0)
MCV: 92 fL (ref 80–100)
Monocyte #: 0.3 x10 3/mm (ref 0.2–1.0)
Monocyte %: 6.7 %
Neutrophil #: 3.2 10*3/uL (ref 1.4–6.5)
Neutrophil %: 60.2 %
Platelet: 166 10*3/uL (ref 150–440)
RBC: 5.47 10*6/uL (ref 4.40–5.90)
RDW: 13 % (ref 11.5–14.5)
WBC: 5.2 10*3/uL (ref 3.8–10.6)

## 2012-08-14 LAB — COMPREHENSIVE METABOLIC PANEL
Albumin: 4.1 g/dL (ref 3.4–5.0)
Alkaline Phosphatase: 82 U/L (ref 50–136)
Anion Gap: 8 (ref 7–16)
BUN: 15 mg/dL (ref 7–18)
Bilirubin,Total: 0.6 mg/dL (ref 0.2–1.0)
Calcium, Total: 9.1 mg/dL (ref 8.5–10.1)
Chloride: 102 mmol/L (ref 98–107)
Co2: 29 mmol/L (ref 21–32)
Creatinine: 0.83 mg/dL (ref 0.60–1.30)
EGFR (African American): >60
EGFR (Non-African Amer.): >60
Glucose: 79 mg/dL (ref 65–99)
Osmolality: 277 (ref 275–301)
Potassium: 4.2 mmol/L (ref 3.5–5.1)
SGOT(AST): 23 U/L (ref 15–37)
SGPT (ALT): 34 U/L (ref 12–78)
Sodium: 139 mmol/L (ref 136–145)
Total Protein: 7.4 g/dL (ref 6.4–8.2)

## 2012-08-14 LAB — IRON: Iron: 134 ug/dL (ref 65–175)

## 2012-08-14 LAB — MAGNESIUM: Magnesium: 1.9 mg/dL

## 2012-08-14 LAB — AMYLASE: Amylase: 93 U/L (ref 25–115)

## 2012-08-14 LAB — FERRITIN: Ferritin (ARMC): 170 ng/mL (ref 8–388)

## 2012-08-14 LAB — FOLATE: Folic Acid: 17.9 ng/mL (ref 3.1–100.0)

## 2012-08-14 LAB — PHOSPHORUS: Phosphorus: 3.4 mg/dL (ref 2.5–4.9)

## 2012-09-02 ENCOUNTER — Other Ambulatory Visit: Payer: Self-pay | Admitting: Family Medicine

## 2012-09-02 NOTE — Telephone Encounter (Signed)
rx called to pharmacy 

## 2012-09-02 NOTE — Telephone Encounter (Signed)
Ok to refill 75 grams, 5 refills

## 2012-09-02 NOTE — Telephone Encounter (Signed)
Pt called requesting status of refill for Androgel. Pt notified refill already called to [facility] [address].

## 2013-05-18 ENCOUNTER — Other Ambulatory Visit: Payer: Self-pay | Admitting: Family Medicine

## 2013-05-19 NOTE — Telephone Encounter (Signed)
rx called to pharmacy 

## 2013-05-19 NOTE — Telephone Encounter (Signed)
ambien #30, 1 refill  Celexa, #90, 1 refill

## 2013-09-06 ENCOUNTER — Other Ambulatory Visit: Payer: Self-pay | Admitting: Family Medicine

## 2013-09-07 NOTE — Telephone Encounter (Signed)
Last office visit 08/12/2012.  Ok to refill?

## 2013-09-08 NOTE — Telephone Encounter (Signed)
Called to [facility] [address] 

## 2013-09-08 NOTE — Telephone Encounter (Signed)
Ok to refill #30, 3 refills

## 2013-10-03 ENCOUNTER — Encounter: Payer: Self-pay | Admitting: Radiology

## 2013-10-06 ENCOUNTER — Other Ambulatory Visit (INDEPENDENT_AMBULATORY_CARE_PROVIDER_SITE_OTHER): Payer: Managed Care, Other (non HMO)

## 2013-10-06 ENCOUNTER — Telehealth: Payer: Self-pay | Admitting: Radiology

## 2013-10-06 ENCOUNTER — Encounter: Payer: Self-pay | Admitting: Family Medicine

## 2013-10-06 DIAGNOSIS — Z79899 Other long term (current) drug therapy: Secondary | ICD-10-CM

## 2013-10-06 DIAGNOSIS — E291 Testicular hypofunction: Secondary | ICD-10-CM

## 2013-10-06 DIAGNOSIS — E1059 Type 1 diabetes mellitus with other circulatory complications: Secondary | ICD-10-CM

## 2013-10-06 DIAGNOSIS — E785 Hyperlipidemia, unspecified: Secondary | ICD-10-CM

## 2013-10-06 DIAGNOSIS — Z125 Encounter for screening for malignant neoplasm of prostate: Secondary | ICD-10-CM

## 2013-10-06 LAB — CBC WITH DIFFERENTIAL/PLATELET
Basophils Absolute: 0.1 10*3/uL (ref 0.0–0.1)
Basophils Relative: 0.7 % (ref 0.0–3.0)
Eosinophils Absolute: 0.2 10*3/uL (ref 0.0–0.7)
Eosinophils Relative: 3.6 % (ref 0.0–5.0)
HCT: 54.1 % — ABNORMAL HIGH (ref 39.0–52.0)
Hemoglobin: 18.7 g/dL (ref 13.0–17.0)
Lymphocytes Relative: 35.8 % (ref 12.0–46.0)
Lymphs Abs: 2.5 10*3/uL (ref 0.7–4.0)
MCHC: 34.6 g/dL (ref 30.0–36.0)
MCV: 91.3 fl (ref 78.0–100.0)
Monocytes Absolute: 0.6 10*3/uL (ref 0.1–1.0)
Monocytes Relative: 8.6 % (ref 3.0–12.0)
Neutro Abs: 3.5 10*3/uL (ref 1.4–7.7)
Neutrophils Relative %: 51.3 % (ref 43.0–77.0)
Platelets: 177 10*3/uL (ref 150.0–400.0)
RBC: 5.93 Mil/uL — ABNORMAL HIGH (ref 4.22–5.81)
RDW: 12.8 % (ref 11.5–14.6)
WBC: 6.9 10*3/uL (ref 4.5–10.5)

## 2013-10-06 LAB — PSA: PSA: 0.59 ng/mL (ref 0.10–4.00)

## 2013-10-06 LAB — LDL CHOLESTEROL, DIRECT: Direct LDL: 146.5 mg/dL

## 2013-10-06 LAB — BASIC METABOLIC PANEL
BUN: 14 mg/dL (ref 6–23)
CO2: 32 mEq/L (ref 19–32)
Calcium: 10 mg/dL (ref 8.4–10.5)
Chloride: 98 mEq/L (ref 96–112)
Creatinine, Ser: 1.1 mg/dL (ref 0.4–1.5)
GFR: 73.72 mL/min (ref >60.00)
Glucose, Bld: 101 mg/dL — ABNORMAL HIGH (ref 70–99)
Potassium: 4.3 mEq/L (ref 3.5–5.1)
Sodium: 138 mEq/L (ref 135–145)

## 2013-10-06 LAB — HEPATIC FUNCTION PANEL
ALT: 32 U/L (ref 0–53)
AST: 31 U/L (ref 0–37)
Albumin: 5 g/dL (ref 3.5–5.2)
Alkaline Phosphatase: 72 U/L (ref 39–117)
Bilirubin, Direct: 0.2 mg/dL (ref 0.0–0.3)
Total Bilirubin: 0.9 mg/dL (ref 0.3–1.2)
Total Protein: 7.9 g/dL (ref 6.0–8.3)

## 2013-10-06 LAB — LIPID PANEL
Cholesterol: 216 mg/dL — ABNORMAL HIGH (ref 0–200)
HDL: 46.1 mg/dL (ref >39.00)
Total CHOL/HDL Ratio: 5
Triglycerides: 160 mg/dL — ABNORMAL HIGH (ref 0.0–149.0)
VLDL: 32 mg/dL (ref 0.0–40.0)

## 2013-10-06 LAB — HEMOGLOBIN A1C: Hgb A1c MFr Bld: 5.4 % (ref 4.6–6.5)

## 2013-10-06 NOTE — Telephone Encounter (Signed)
noted 

## 2013-10-06 NOTE — Telephone Encounter (Signed)
[facility] called critical results, HGB-18.7, HCT-54.1. Results given to Dr Kathie Rhodes. [place].

## 2013-10-07 LAB — TESTOSTERONE, FREE, TOTAL, SHBG
Sex Hormone Binding: 37 nmol/L (ref 13–71)
Testosterone, Free: 58.4 pg/mL (ref 47.0–244.0)
Testosterone-% Free: 1.8 % (ref 1.6–2.9)
Testosterone: 317 ng/dL (ref 300–890)

## 2013-10-15 ENCOUNTER — Encounter: Payer: Self-pay | Admitting: Radiology

## 2013-10-17 ENCOUNTER — Ambulatory Visit (INDEPENDENT_AMBULATORY_CARE_PROVIDER_SITE_OTHER): Payer: Managed Care, Other (non HMO) | Admitting: Family Medicine

## 2013-10-17 ENCOUNTER — Encounter: Payer: Self-pay | Admitting: Family Medicine

## 2013-10-17 ENCOUNTER — Telehealth: Payer: Self-pay

## 2013-10-17 VITALS — BP 114/68 | HR 74 | Temp 97.7°F | Ht 69.25 in | Wt 217.8 lb

## 2013-10-17 DIAGNOSIS — Z0000 Encounter for general adult medical examination without abnormal findings: Secondary | ICD-10-CM

## 2013-10-17 DIAGNOSIS — Z23 Encounter for immunization: Secondary | ICD-10-CM

## 2013-10-17 NOTE — Progress Notes (Signed)
Date:  10/17/2013   Name:  Tyler Ramsey   DOB:  [birth year]   MRN:  [id number] Gender: male Age: 57 y.o.  Primary Physician:  Hannah Beat, MD   Chief Complaint: Annual Exam   Subjective:   History of Present Illness:  Tyler Ramsey is a 57 y.o. pleasant patient who presents with the following:  Preventative Health Maintenance Visit:  Health Maintenance Summary Reviewed and updated, unless pt declines services.  Tobacco History Reviewed. Alcohol: No concerns, no excessive use Exercise Habits: Some activity, rec at least 30 mins 5 times a week STD concerns: no risk or activity to increase risk Drug Use: None Encouraged self-testicular check  Health Maintenance  Topic Date Due  . Influenza Vaccine  05/23/2013  . Colonoscopy  10/23/2016  . Tetanus/tdap  10/18/2023    Immunization History  Administered Date(s) Administered  . Influenza Split 08/12/2012  . Tdap 10/17/2013    Patient Active Problem List   Diagnosis Date Noted  . HYPERTENSION 09/24/2008    Priority: High  . SLEEP APNEA 09/24/2008    Priority: Medium  . FATIGUE 06/23/2010  . LIVER FUNCTION TESTS, ABNORMAL, HX OF 09/03/2009  . DEPRESSION 11/01/2008  . ALLERGIC RHINITIS 09/24/2008    Past Medical History  Diagnosis Date  . Diabetes mellitus   . Hyperlipidemia   . Hypertension   . Depression   . Allergy     allergic rhinitis  . Sleep apnea     Past Surgical History  Procedure Laterality Date  . Tonsillectomy  1969  . Total knee arthroplasty  5/11    Left, Dr Carma Lair  . Knee arthroscopy w/ acl reconstruction and hamstring graft  1985    History   Social History  . Marital Status: Married    Spouse Name: N/A    Number of Children: N/A  . Years of Education: N/A   Occupational History  . 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 Smoking status: Never Smoker   . Smokeless tobacco: Never Used  . Alcohol Use: No  . Drug Use: No  . Sexual  Activity: Not on file   Other Topics Concern  . Not on file   Social History Narrative   Lived in [place], moved to [place] and plans to settle in [place].    Family History  Problem Relation Age of Onset  . Emphysema Father   . Heart disease Mother     Allergies  Allergen Reactions  . Sulfonamide Derivatives   . Morphine And Related Rash    Medication list has been reviewed and updated.  Review of Systems:  General: Denies fever, chills, sweats. No significant weight loss. Eyes: Denies blurring,significant itching ENT: Denies earache, sore throat, and hoarseness. Cardiovascular: Denies chest pains, palpitations, dyspnea on exertion Respiratory: Denies cough, dyspnea at rest,wheeezing Breast: no concerns about lumps GI: Denies nausea, vomiting, diarrhea, constipation, change in bowel habits, abdominal pain, melena, hematochezia GU: Denies penile discharge, ED, urinary flow / outflow problems. No STD concerns. Musculoskeletal: Denies back pain, joint pain Derm: Denies rash, itching Neuro: Denies  paresthesias, frequent falls, frequent headaches Psych: Denies depression, anxiety Endocrine: Denies cold intolerance, heat intolerance, polydipsia Heme: Denies enlarged lymph nodes Allergy: No hayfever  Objective:   Physical Examination: BP 114/68  Pulse 74  Temp(Src) 97.7 F (36.5 C) (Oral)  Ht 5' 9.25" (1.759 m)  Wt 217 lb 12 oz (98.771 kg)  BMI 31.92 kg/m2  SpO2 96% Ideal Body Weight: Weight  in (lb) to have BMI = 25: 170.2  GEN: well developed, well nourished, no acute distress Eyes: conjunctiva and lids normal, PERRLA, EOMI ENT: TM clear, nares clear, oral exam WNL Neck: supple, no lymphadenopathy, no thyromegaly, no JVD Pulm: clear to auscultation and percussion, respiratory effort normal CV: regular rate and rhythm, S1-S2, no murmur, rub or gallop, no bruits, peripheral pulses normal and symmetric, no cyanosis, clubbing, edema or varicosities GI:  soft, non-tender; no hepatosplenomegaly, masses; active bowel sounds all quadrants GU: no hernia, testicular mass, penile discharge Lymph: no cervical, axillary or inguinal adenopathy MSK: gait normal, muscle tone and strength WNL, no joint swelling, effusions, discoloration, crepitus  SKIN: clear, good turgor, color WNL, no rashes, lesions, or ulcerations Neuro: normal mental status, normal strength, sensation, and motion Psych: alert; oriented to person, place and time, normally interactive and not anxious or depressed in appearance.  All labs reviewed with patient.  Lipids:    Component Value Date/Time   CHOL 216* 10/06/2013 0818   TRIG 160.0* 10/06/2013 0818   HDL 46.10 10/06/2013 0818   LDLDIRECT 146.5 10/06/2013 0818   VLDL 32.0 10/06/2013 0818   CHOLHDL 5 10/06/2013 0818    CBC:    Component Value Date/Time   WBC 6.9 10/06/2013 0818   HGB 18.7* 10/06/2013 0818   HCT 54.1* 10/06/2013 0818   PLT 177.0 10/06/2013 0818   MCV 91.3 10/06/2013 0818   NEUTROABS 3.5 10/06/2013 0818   LYMPHSABS 2.5 10/06/2013 0818   MONOABS 0.6 10/06/2013 0818   EOSABS 0.2 10/06/2013 0818   BASOSABS 0.1 10/06/2013 0818    Basic Metabolic Panel:    Component Value Date/Time   NA 138 10/06/2013 0818   K 4.3 10/06/2013 0818   CL 98 10/06/2013 0818   CO2 32 10/06/2013 0818   BUN 14 10/06/2013 0818   CREATININE 1.1 10/06/2013 0818   GLUCOSE 101* 10/06/2013 0818   CALCIUM 10.0 10/06/2013 0818    Lab Results  Component Value Date   ALT 32 10/06/2013   AST 31 10/06/2013   ALKPHOS 72 10/06/2013   BILITOT 0.9 10/06/2013    Lab Results  Component Value Date   TSH 0.89 02/03/2011    Lab Results  Component Value Date   PSA 0.59 10/06/2013   PSA 0.42 08/12/2012   PSA 0.57 02/03/2011    Assessment & Plan:   Health Maintenance Exam: The patient's preventative maintenance and recommended screening tests for an annual wellness exam were reviewed in full today. Brought up to date  unless services declined.  Counselled on the importance of diet, exercise, and its role in overall health and mortality. The patient's FH and SH was reviewed, including their home life, tobacco status, and drug and alcohol status.  Routine general medical examination at a health care facility  Need for Tdap vaccination - Plan: Tdap vaccine greater than or equal to 7yo IM   Work on [employer],  Oshua Mcconaha T. Kahlen Boyde, MD, CAQ Sports Medicine  [employer] at [facility] [address] [place] [address] Phone: [phone] Fax: [phone]

## 2013-10-17 NOTE — Telephone Encounter (Signed)
Pt was seen in office today and forgot wellness form; pt will bring form back this afternoon.

## 2013-10-17 NOTE — Progress Notes (Signed)
Pre-visit discussion using our clinic review tool. No additional management support is needed unless otherwise documented below in the visit note.  

## 2013-10-20 NOTE — Telephone Encounter (Signed)
Spoke to pt on 12/26 and informed him that the form was completed and available for him to pick up at the front desk

## 2013-12-03 ENCOUNTER — Other Ambulatory Visit: Payer: Self-pay | Admitting: Family Medicine

## 2014-02-28 ENCOUNTER — Other Ambulatory Visit: Payer: Self-pay | Admitting: Family Medicine

## 2014-06-05 ENCOUNTER — Other Ambulatory Visit: Payer: Self-pay | Admitting: Family Medicine

## 2014-08-12 ENCOUNTER — Other Ambulatory Visit: Payer: Self-pay

## 2014-08-12 MED ORDER — CITALOPRAM HYDROBROMIDE 40 MG PO TABS: ORAL_TABLET | ORAL | Status: DC

## 2014-08-12 NOTE — Telephone Encounter (Signed)
Pt left v/m requesting refill for citalopram 40 mg to [facility] [address]. For next refill; pt has CPX scheduled 12/05/14. Left v/m to pt refill done.

## 2014-11-17 ENCOUNTER — Other Ambulatory Visit: Payer: Self-pay | Admitting: Family Medicine

## 2014-11-17 DIAGNOSIS — E785 Hyperlipidemia, unspecified: Secondary | ICD-10-CM

## 2014-11-17 DIAGNOSIS — Z79899 Other long term (current) drug therapy: Secondary | ICD-10-CM

## 2014-11-17 DIAGNOSIS — Z125 Encounter for screening for malignant neoplasm of prostate: Secondary | ICD-10-CM

## 2014-11-17 DIAGNOSIS — E119 Type 2 diabetes mellitus without complications: Secondary | ICD-10-CM

## 2014-11-17 DIAGNOSIS — E349 Endocrine disorder, unspecified: Secondary | ICD-10-CM

## 2014-11-18 ENCOUNTER — Other Ambulatory Visit (INDEPENDENT_AMBULATORY_CARE_PROVIDER_SITE_OTHER): Payer: Managed Care, Other (non HMO)

## 2014-11-18 DIAGNOSIS — Z125 Encounter for screening for malignant neoplasm of prostate: Secondary | ICD-10-CM

## 2014-11-18 DIAGNOSIS — E349 Endocrine disorder, unspecified: Secondary | ICD-10-CM

## 2014-11-18 DIAGNOSIS — E119 Type 2 diabetes mellitus without complications: Secondary | ICD-10-CM

## 2014-11-18 LAB — MICROALBUMIN / CREATININE URINE RATIO
Creatinine,U: 261.5 mg/dL
Microalb Creat Ratio: 0.7 mg/g (ref 0.0–30.0)
Microalb, Ur: 1.8 mg/dL (ref 0.0–1.9)

## 2014-11-18 LAB — PSA: PSA: 0.47 ng/mL (ref 0.10–4.00)

## 2014-11-18 LAB — HEMOGLOBIN A1C: Hgb A1c MFr Bld: 5.7 % (ref 4.6–6.5)

## 2014-11-19 LAB — TESTOSTERONE, FREE, TOTAL, SHBG
Sex Hormone Binding: 28 nmol/L (ref 22–77)
Testosterone, Free: 52.8 pg/mL (ref 47.0–244.0)
Testosterone-% Free: 2.1 % (ref 1.6–2.9)
Testosterone: 249 ng/dL — ABNORMAL LOW (ref 300–890)

## 2014-11-25 ENCOUNTER — Telehealth: Payer: Self-pay

## 2014-11-25 ENCOUNTER — Encounter: Payer: Self-pay | Admitting: Family Medicine

## 2014-11-25 ENCOUNTER — Ambulatory Visit (INDEPENDENT_AMBULATORY_CARE_PROVIDER_SITE_OTHER): Payer: Managed Care, Other (non HMO) | Admitting: Family Medicine

## 2014-11-25 VITALS — BP 104/55 | HR 66 | Temp 97.7°F | Ht 70.0 in | Wt 232.5 lb

## 2014-11-25 DIAGNOSIS — E291 Testicular hypofunction: Secondary | ICD-10-CM

## 2014-11-25 DIAGNOSIS — Z0000 Encounter for general adult medical examination without abnormal findings: Secondary | ICD-10-CM

## 2014-11-25 DIAGNOSIS — F4320 Adjustment disorder, unspecified: Secondary | ICD-10-CM

## 2014-11-25 DIAGNOSIS — F4321 Adjustment disorder with depressed mood: Secondary | ICD-10-CM

## 2014-11-25 MED ORDER — ZOLPIDEM TARTRATE 10 MG PO TABS: 10.0000 mg | ORAL_TABLET | Freq: Every evening | ORAL | Status: DC | PRN

## 2014-11-25 MED ORDER — "NEEDLE (DISP) 25G X 1-1/2"" MISC": Status: DC

## 2014-11-25 MED ORDER — TESTOSTERONE CYPIONATE 100 MG/ML IM SOLN: 50.0000 mg | INTRAMUSCULAR | Status: DC

## 2014-11-25 MED ORDER — HYDROCHLOROTHIAZIDE 25 MG PO TABS: 12.5000 mg | ORAL_TABLET | ORAL | Status: DC | PRN

## 2014-11-25 MED ORDER — SYRINGE (DISPOSABLE) 3 ML MISC: Status: DC

## 2014-11-25 MED ORDER — TESTOSTERONE 20.25 MG/ACT (1.62%) TD GEL: TRANSDERMAL | Status: DC

## 2014-11-25 MED ORDER — CITALOPRAM HYDROBROMIDE 40 MG PO TABS: ORAL_TABLET | ORAL | Status: DC

## 2014-11-25 NOTE — Progress Notes (Signed)
Dr. Frederico Hamman T. Lillianne Eick, MD, [facility] [place], [address] Phone: [phone] Fax: [phone]  11/25/2014  Patient: ANTHONY TAMBURO, MRN: [id number], DOB: July 17, 1956, 59 y.o.  Primary Physician:  Owens Loffler, MD  Chief Complaint: Annual Exam  Subjective:   EDGARDO PETRENKO is a 59 y.o. pleasant patient who presents with the following:  Preventative Health Maintenance Visit:  Health Maintenance Summary Reviewed and updated, unless pt declines services.  Tobacco History Reviewed. Alcohol: No concerns, no excessive use Exercise Habits: Some activity, rec at least 30 mins 5 times a week STD concerns: no risk or activity to increase risk Drug Use: None Encouraged self-testicular check  Health Maintenance  Topic Date Due  . PNEUMOCOCCAL POLYSACCHARIDE VACCINE (1) 04/10/1957  . FOOT EXAM  04/10/1965  . OPHTHALMOLOGY EXAM  12/01/2011  . INFLUENZA VACCINE  11/26/2015 (Originally 05/23/2014)  . HEMOGLOBIN A1C  05/19/2015  . URINE MICROALBUMIN  11/19/2015  . COLONOSCOPY  10/23/2016  . TETANUS/TDAP  10/18/2023   + 50 pound weight gain.  Son died, 76 years old.  Depressed some.  10 months ago. Hard to deal with this. Still taking celexa.   Androgel: None for the last month.  1 pump a day Convert to IM testosterone Cost increase on Androgel for the patient.   Lab Results  Component Value Date   TESTOSTERONE 249* 11/18/2014      Immunization History  Administered Date(s) Administered  . Influenza Split 08/12/2012  . Tdap 10/17/2013   Patient Active Problem List   Diagnosis Date Noted  . HYPERTENSION 09/24/2008    Priority: High  . SLEEP APNEA 09/24/2008    Priority: Medium  . Hypogonadism in male 11/29/2014  . FATIGUE 06/23/2010  . LIVER FUNCTION TESTS, ABNORMAL, HX OF 09/03/2009  . DEPRESSION 11/01/2008  . ALLERGIC RHINITIS 09/24/2008   Past Medical History  Diagnosis Date  . Diabetes  mellitus   . Hyperlipidemia   . Hypertension   . Depression   . Allergy     allergic rhinitis  . Sleep apnea   . Hypogonadism in male 11/29/2014   Past Surgical History  Procedure Laterality Date  . Tonsillectomy  1969  . Total knee arthroplasty  5/11    Left, Dr Coralee North  . Knee arthroscopy w/ acl reconstruction and hamstring graft  1985   History   Social History  . Marital Status: Married    Spouse Name: N/A    Number of Children: N/A  . Years of Education: N/A   Occupational History  . 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 Smoking status: Never Smoker   . Smokeless tobacco: Never Used  . Alcohol Use: No  . Drug Use: No  . Sexual Activity: Not on file   Other Topics Concern  . Not on file   Social History Narrative   Lived in [place], moved to [place] and plans to settle in [place].   Family History  Problem Relation Age of Onset  . Emphysema Father   . Heart disease Mother    Allergies  Allergen Reactions  . Sulfonamide Derivatives   . Morphine And Related Rash    Medication list has been reviewed and updated.   General: Denies fever, chills, sweats. No significant weight loss. WEIGHT GAIN. FATIGUE. Eyes: Denies blurring,significant itching ENT: Denies earache, sore throat, and hoarseness. Cardiovascular: Denies chest pains, palpitations, dyspnea on exertion Respiratory: Denies cough, dyspnea  at rest,wheeezing Breast: no concerns about lumps GI: Denies nausea, vomiting, diarrhea, constipation, change in bowel habits, abdominal pain, melena, hematochezia GU: Denies penile discharge, ED, urinary flow / outflow problems. No STD concerns. Musculoskeletal: Denies back pain, joint pain Derm: Denies rash, itching Neuro: Denies  paresthesias, frequent falls, frequent headaches Psych: DEPRESSION Endocrine: Denies cold intolerance, heat intolerance, polydipsia Heme: Denies enlarged lymph nodes Allergy: No  hayfever  Objective:   BP 104/55 mmHg  Pulse 66  Temp(Src) 97.7 F (36.5 C) (Oral)  Ht '5\' 10"'  (1.778 m)  Wt 232 lb 8 oz (105.461 kg)  BMI 33.36 kg/m2 Ideal Body Weight: Weight in (lb) to have BMI = 25: 173.9  No exam data present  GEN: well developed, well nourished, no acute distress Eyes: conjunctiva and lids normal, PERRLA, EOMI ENT: TM clear, nares clear, oral exam WNL Neck: supple, no lymphadenopathy, no thyromegaly, no JVD Pulm: clear to auscultation and percussion, respiratory effort normal CV: regular rate and rhythm, S1-S2, no murmur, rub or gallop, no bruits, peripheral pulses normal and symmetric, no cyanosis, clubbing, edema or varicosities GI: soft, non-tender; no hepatosplenomegaly, masses; active bowel sounds all quadrants GU: no hernia, testicular mass, penile discharge Lymph: no cervical, axillary or inguinal adenopathy MSK: gait normal, muscle tone and strength WNL, no joint swelling, effusions, discoloration, crepitus  SKIN: clear, good turgor, color WNL, no rashes, lesions, or ulcerations Neuro: normal mental status, normal strength, sensation, and motion Psych: alert; oriented to person, place and time, normally interactive and not anxious or depressed in appearance. All labs reviewed with patient.  Additional labs from pt's work reviewed.   Lipids:    Component Value Date/Time   CHOL 216* 10/06/2013 0818   TRIG 160.0* 10/06/2013 0818   HDL 46.10 10/06/2013 0818   LDLDIRECT 146.5 10/06/2013 0818   VLDL 32.0 10/06/2013 0818   CHOLHDL 5 10/06/2013 0818   CBC: CBC Latest Ref Rng 10/06/2013 02/03/2011 03/17/2010  WBC 4.5 - 10.5 K/uL 6.9 6.4 11.3(H)  Hemoglobin 13.0 - 17.0 g/dL 18.7(HH) 17.5(H) 13.0  Hematocrit 39.0 - 52.0 % 54.1(H) 49.9 37.3(L)  Platelets 150.0 - 400.0 K/uL 177.0 159.0 741    Basic Metabolic Panel:    Component Value Date/Time   NA 138 10/06/2013 0818   K 4.3 10/06/2013 0818   CL 98 10/06/2013 0818   CO2 32 10/06/2013 0818   BUN  14 10/06/2013 0818   CREATININE 1.1 10/06/2013 0818   GLUCOSE 101* 10/06/2013 0818   CALCIUM 10.0 10/06/2013 0818   Hepatic Function Latest Ref Rng 10/06/2013 08/12/2012 08/09/2011  Total Protein 6.0 - 8.3 g/dL 7.9 6.9 7.9  Albumin 3.5 - 5.2 g/dL 5.0 4.1 4.6  AST 0 - 37 U/L 31 25 79(H)  ALT 0 - 53 U/L 32 26 95(H)  Alk Phosphatase 39 - 117 U/L 72 63 59  Total Bilirubin 0.3 - 1.2 mg/dL 0.9 0.6 1.1  Bilirubin, Direct 0.0 - 0.3 mg/dL 0.2 0.2 0.0    Lab Results  Component Value Date   TSH 0.89 02/03/2011   Lab Results  Component Value Date   PSA 0.47 11/18/2014   PSA 0.59 10/06/2013   PSA 0.42 08/12/2012    Assessment and Plan:   Routine general medical examination at a health care facility  Hypogonadism in male: Patient's testosterone level still low.  He is also having some fatigue and depression.  This may be making this worse.  We talked back and forth about whether or not to keep him on AndroGel, but  different product, or changing him to Depo-testosterone.  Initially, he wanted to remain on topical medication, but he called later in the day and requested a change to injectable intramuscular testosterone.  This is reasonable.  Grief reaction: discussed his son's passing.  Health Maintenance Exam: The patient's preventative maintenance and recommended screening tests for an annual wellness exam were reviewed in full today. Brought up to date unless services declined.  Counselled on the importance of diet, exercise, and its role in overall health and mortality. The patient's FH and SH was reviewed, including their home life, tobacco status, and drug and alcohol status.  Follow-up: Return in 1 year (on 11/26/2015). Unless noted, follow-up in 1 year for Health Maintenance Exam.  New Prescriptions   NEEDLE, DISP, 25 G 25G X 1-1/2" MISC    Use as directed   SYRINGE, DISPOSABLE, 3 ML MISC    Use as directed   TESTOSTERONE CYPIONATE (DEPOTESTOTERONE CYPIONATE) 100 MG/ML INJECTION     Inject 0.5 mLs (50 mg total) into the muscle every 14 (fourteen) days. For IM use only   No orders of the defined types were placed in this encounter.    Signed,  Maud Deed. Jasyn Mey, MD   Patient's Medications  New Prescriptions   NEEDLE, DISP, 25 G 25G X 1-1/2" MISC    Use as directed   SYRINGE, DISPOSABLE, 3 ML MISC    Use as directed   TESTOSTERONE CYPIONATE (DEPOTESTOTERONE CYPIONATE) 100 MG/ML INJECTION    Inject 0.5 mLs (50 mg total) into the muscle every 14 (fourteen) days. For IM use only  Previous Medications   ASPIRIN 81 MG TABLET    Take 81 mg by mouth daily.    Modified Medications   Modified Medication Previous Medication   CITALOPRAM (CELEXA) 40 MG TABLET citalopram (CELEXA) 40 MG tablet      TAKE ONE TABLET BY MOUTH ONCE DAILY    TAKE ONE TABLET BY MOUTH ONCE DAILY   HYDROCHLOROTHIAZIDE (HYDRODIURIL) 25 MG TABLET hydrochlorothiazide (HYDRODIURIL) 25 MG tablet      Take 0.5 tablets (12.5 mg total) by mouth as needed.    Take 0.5 tablets (12.5 mg total) by mouth as needed.   ZOLPIDEM (AMBIEN) 10 MG TABLET zolpidem (AMBIEN) 10 MG tablet      Take 1 tablet (10 mg total) by mouth at bedtime as needed. for sleep    TAKE ONE TABLET BY MOUTH AT BEDTIME AS NEEDED FOR SLEEP  Discontinued Medications   ANDROGEL PUMP 20.25 MG/ACT (1.62%) GEL    APPLY FOUR APPLICATION ON TO SKIN EVERY DAY

## 2014-11-25 NOTE — Telephone Encounter (Signed)
Request call back. I did this, instructions, needles, syringes.   He would likely get several months out of 1 vial with his dose.

## 2014-11-25 NOTE — Telephone Encounter (Signed)
Pt left v/m; pt was seen today and pt wants to do price comparison on Androgel and injectable testosterone. The generic androgel cost to pt was $200.00 for pump container. Pt request injectable sent to [facility] [address] for price comparison. Pt request cb.

## 2014-11-25 NOTE — Progress Notes (Signed)
Pre visit review using our clinic review tool, if applicable. No additional management support is needed unless otherwise documented below in the visit note. 

## 2014-11-26 NOTE — Telephone Encounter (Signed)
Mr. Tyler Ramsey notified prescription for testosterone, syringes and needles have been sent to [facility] [address].

## 2014-11-26 NOTE — Telephone Encounter (Signed)
[facility] [phone].

## 2014-11-29 ENCOUNTER — Encounter: Payer: Self-pay | Admitting: Family Medicine

## 2014-11-29 DIAGNOSIS — E291 Testicular hypofunction: Secondary | ICD-10-CM | POA: Insufficient documentation

## 2014-11-29 HISTORY — DX: Testicular hypofunction: E29.1

## 2015-02-14 NOTE — Op Note (Signed)
PATIENT NAME:  Tyler Ramsey, Tyler Ramsey MR#:  [id number] DATE OF BIRTH:  1956-03-31  DATE OF PROCEDURE:  02/13/2012  PREOPERATIVE DIAGNOSIS: Morbid obesity, comorbids.  POSTOPERATIVE DIAGNOSIS: Morbid obesity, comorbids.  PROCEDURE:  Laparoscopic sleeve gastrectomy.  SURGEON: Primus BravoJon Teigen Parslow, MD  ASSISTANT:  Mariella SaaSarah Stout, PA-C  ANESTHESIA:  General endotracheal.  CLINICAL HISTORY:  See History and Physical for details.   COMPLICATIONS: None.  ESTIMATED BLOOD LOSS: None.  DETAILS OF PROCEDURE:  The patient was taken to the operating room and placed on the operating room table, in the supine position, with appropriate monitors and supplemental oxygen being delivered.  Broad spectrum IV antibiotics were administered. The patient was placed under general anesthesia without incident.  The abdomen was prepped and draped in the usual sterile fashion.  Access was obtained using 5 mm Optical trocar. Pneumoperitoneum was established without difficulty. Multiple other ports were placed in preparation for sleeve gastrectomy. A liver retractor was placed without incident. The entire stomach was mobilized from 5 cm from the pylorus all the way up to the fundus, and the fundus was mobilized off the left crura as well completely freeing up the posterior portion of the stomach. Posterior attachments were taken down so the crura could be visualized from both sides.  At that point, everything was removed from the stomach and a [address] [place] Bougie was placed down into the antrum. An Echelon green load stapler was used to bisect the antrum on first fire starting approximately 5 to 6 cm from the pylorus. I then continued up along the Bougie using a blue load stapler with excellent affect with care not to get too close to the Bougie itself, with minimal traction. This continued all the way up to the left crura. The excess stomach was placed on the side and the Bougie was removed and endoscopy showed no evidence of obstruction at that  time.  The excess stomach was removed through the abdominal cavity, and the wounds were closed using 4-0 Vicryl and Dermabond.   ____________________________ Primus BravoJon Noya Santarelli, MD jb:cbb D: 02/13/2012 16:34:46 ET T: 02/13/2012 17:01:48 ET JOB#: [id number]  cc: Primus BravoJon Callan Norden, MD, <Dictator> Cletis AthensJON Marjean DonnaM Alma Mohiuddin MD ELECTRONICALLY SIGNED 02/14/2012 8:43

## 2015-04-03 ENCOUNTER — Other Ambulatory Visit: Payer: Self-pay | Admitting: Family Medicine

## 2015-04-04 NOTE — Telephone Encounter (Signed)
Last office visit 11/25/2014.  Last refilled 11/25/2014 for #30 with 3 refills.  Ok to refill?

## 2015-04-04 NOTE — Telephone Encounter (Signed)
30, 3 ref.

## 2015-04-05 ENCOUNTER — Other Ambulatory Visit: Payer: Self-pay | Admitting: Family Medicine

## 2015-04-05 NOTE — Telephone Encounter (Signed)
Called to [facility] [address] 

## 2015-06-07 ENCOUNTER — Other Ambulatory Visit: Payer: Self-pay | Admitting: Family Medicine

## 2015-06-07 MED ORDER — HYDROCHLOROTHIAZIDE 25 MG PO TABS: ORAL_TABLET | ORAL | Status: DC

## 2015-06-07 NOTE — Addendum Note (Signed)
Addended by: Damita Lack on: 06/07/2015 02:28 PM   Modules accepted: Orders

## 2015-11-22 ENCOUNTER — Other Ambulatory Visit (INDEPENDENT_AMBULATORY_CARE_PROVIDER_SITE_OTHER): Payer: Managed Care, Other (non HMO)

## 2015-11-22 ENCOUNTER — Telehealth: Payer: Self-pay | Admitting: Radiology

## 2015-11-22 DIAGNOSIS — Z125 Encounter for screening for malignant neoplasm of prostate: Secondary | ICD-10-CM

## 2015-11-22 DIAGNOSIS — E785 Hyperlipidemia, unspecified: Secondary | ICD-10-CM

## 2015-11-22 DIAGNOSIS — Z79899 Other long term (current) drug therapy: Secondary | ICD-10-CM | POA: Diagnosis not present

## 2015-11-22 DIAGNOSIS — E119 Type 2 diabetes mellitus without complications: Secondary | ICD-10-CM | POA: Diagnosis not present

## 2015-11-22 DIAGNOSIS — E291 Testicular hypofunction: Secondary | ICD-10-CM

## 2015-11-22 LAB — CBC WITH DIFFERENTIAL/PLATELET
Basophils Absolute: 0 10*3/uL (ref 0.0–0.1)
Basophils Relative: 0.4 % (ref 0.0–3.0)
Eosinophils Absolute: 0.3 10*3/uL (ref 0.0–0.7)
Eosinophils Relative: 4 % (ref 0.0–5.0)
HCT: 56.5 % — ABNORMAL HIGH (ref 39.0–52.0)
Hemoglobin: 18.9 g/dL (ref 13.0–17.0)
Lymphocytes Relative: 31.9 % (ref 12.0–46.0)
Lymphs Abs: 2.1 10*3/uL (ref 0.7–4.0)
MCHC: 33.5 g/dL (ref 30.0–36.0)
MCV: 93.6 fl (ref 78.0–100.0)
Monocytes Absolute: 0.6 10*3/uL (ref 0.1–1.0)
Monocytes Relative: 9.2 % (ref 3.0–12.0)
Neutro Abs: 3.5 10*3/uL (ref 1.4–7.7)
Neutrophils Relative %: 54.5 % (ref 43.0–77.0)
Platelets: 185 10*3/uL (ref 150.0–400.0)
RBC: 6.03 Mil/uL — ABNORMAL HIGH (ref 4.22–5.81)
RDW: 13 % (ref 11.5–15.5)
WBC: 6.5 10*3/uL (ref 4.0–10.5)

## 2015-11-22 LAB — LIPID PANEL
Cholesterol: 231 mg/dL — ABNORMAL HIGH (ref 0–200)
HDL: 40.6 mg/dL (ref >39.00)
NonHDL: 190.2
Total CHOL/HDL Ratio: 6
Triglycerides: 289 mg/dL — ABNORMAL HIGH (ref 0.0–149.0)
VLDL: 57.8 mg/dL — ABNORMAL HIGH (ref 0.0–40.0)

## 2015-11-22 LAB — HEPATIC FUNCTION PANEL
ALT: 42 U/L (ref 0–53)
AST: 32 U/L (ref 0–37)
Albumin: 4.5 g/dL (ref 3.5–5.2)
Alkaline Phosphatase: 54 U/L (ref 39–117)
Bilirubin, Direct: 0.1 mg/dL (ref 0.0–0.3)
Total Bilirubin: 0.6 mg/dL (ref 0.2–1.2)
Total Protein: 7.6 g/dL (ref 6.0–8.3)

## 2015-11-22 LAB — LDL CHOLESTEROL, DIRECT: Direct LDL: 155 mg/dL

## 2015-11-22 LAB — BASIC METABOLIC PANEL
BUN: 15 mg/dL (ref 6–23)
CO2: 33 mEq/L — ABNORMAL HIGH (ref 19–32)
Calcium: 9.9 mg/dL (ref 8.4–10.5)
Chloride: 98 mEq/L (ref 96–112)
Creatinine, Ser: 1.04 mg/dL (ref 0.40–1.50)
GFR: 77.27 mL/min (ref >60.00)
Glucose, Bld: 120 mg/dL — ABNORMAL HIGH (ref 70–99)
Potassium: 4.4 mEq/L (ref 3.5–5.1)
Sodium: 139 mEq/L (ref 135–145)

## 2015-11-22 LAB — HEMOGLOBIN A1C: Hgb A1c MFr Bld: 6 % (ref 4.6–6.5)

## 2015-11-22 LAB — PSA: PSA: 0.67 ng/mL (ref 0.10–4.00)

## 2015-11-22 NOTE — Telephone Encounter (Signed)
noted 

## 2015-11-22 NOTE — Telephone Encounter (Signed)
[facility] called a critical HGB, 18.9. Results given to Dr Patsy Lager

## 2015-11-23 LAB — TESTOSTERONE, FREE, TOTAL, SHBG
Sex Hormone Binding: 18 nmol/L — ABNORMAL LOW (ref 22–77)
Testosterone, Free: 202.4 pg/mL (ref 47.0–244.0)
Testosterone-% Free: 2.9 % (ref 1.6–2.9)
Testosterone: 689 ng/dL (ref 250–827)

## 2015-11-24 ENCOUNTER — Other Ambulatory Visit: Payer: Managed Care, Other (non HMO)

## 2015-12-01 ENCOUNTER — Ambulatory Visit (INDEPENDENT_AMBULATORY_CARE_PROVIDER_SITE_OTHER): Payer: Managed Care, Other (non HMO) | Admitting: Family Medicine

## 2015-12-01 ENCOUNTER — Encounter: Payer: Self-pay | Admitting: Family Medicine

## 2015-12-01 VITALS — BP 126/76 | HR 80 | Temp 98.2°F | Ht 70.0 in | Wt 255.2 lb

## 2015-12-01 DIAGNOSIS — D751 Secondary polycythemia: Secondary | ICD-10-CM | POA: Diagnosis not present

## 2015-12-01 DIAGNOSIS — Z0000 Encounter for general adult medical examination without abnormal findings: Secondary | ICD-10-CM | POA: Diagnosis not present

## 2015-12-01 DIAGNOSIS — Z23 Encounter for immunization: Secondary | ICD-10-CM

## 2015-12-01 DIAGNOSIS — E291 Testicular hypofunction: Secondary | ICD-10-CM

## 2015-12-01 DIAGNOSIS — F331 Major depressive disorder, recurrent, moderate: Secondary | ICD-10-CM | POA: Diagnosis not present

## 2015-12-01 MED ORDER — BUPROPION HCL ER (XL) 150 MG PO TB24: 150.0000 mg | ORAL_TABLET | Freq: Every day | ORAL | Status: DC

## 2015-12-01 MED ORDER — CITALOPRAM HYDROBROMIDE 40 MG PO TABS: ORAL_TABLET | ORAL | Status: DC

## 2015-12-01 MED ORDER — ZOLPIDEM TARTRATE 10 MG PO TABS: ORAL_TABLET | ORAL | Status: DC

## 2015-12-01 MED ORDER — HYDROCHLOROTHIAZIDE 25 MG PO TABS: ORAL_TABLET | ORAL | Status: DC

## 2015-12-01 NOTE — Progress Notes (Signed)
Pre visit review using our clinic review tool, if applicable. No additional management support is needed unless otherwise documented below in the visit note. 

## 2015-12-01 NOTE — Progress Notes (Signed)
Dr. Frederico Hamman T. Darryll Raju, MD, [facility] [place], [address] Phone: [phone] Fax: [phone]  12/01/2015  Patient: Tyler Ramsey, MRN: [id number], DOB: 1955-12-21, 60 y.o.  Primary Physician:  Owens Loffler, MD   Chief Complaint  Patient presents with  . Annual Exam   Subjective:   Tyler Ramsey is a 60 y.o. pleasant patient who presents with the following:  Preventative Health Maintenance Visit and acute issues:  Health Maintenance Summary Reviewed and updated, unless pt declines services.  Tobacco History Reviewed. Alcohol: No concerns, no excessive use Exercise Habits: Rare activity, rec at least 30 mins 5 times a week STD concerns: no risk or activity to increase risk Drug Use: None Encouraged self-testicular check  Health Maintenance  Topic Date Due  . Hepatitis C Screening  1956-05-19  . HIV Screening  04/10/1970  . OPHTHALMOLOGY EXAM  12/01/2011  . URINE MICROALBUMIN  11/19/2015  . INFLUENZA VACCINE  01/21/2016 (Originally 05/24/2015)  . HEMOGLOBIN A1C  05/21/2016  . COLONOSCOPY  10/23/2016  . FOOT EXAM  12/02/2016  . PNEUMOCOCCAL POLYSACCHARIDE VACCINE (2) 11/30/2020  . TETANUS/TDAP  10/18/2023  . ZOSTAVAX  Completed   Immunization History  Administered Date(s) Administered  . Influenza Split 08/12/2012  . Pneumococcal Polysaccharide-23 12/01/2015  . Tdap 10/17/2013  . Zoster 12/01/2015   Polycythemia:  Free test level came back at 202.  He is asymptomatic but his hemoglobin is 18.9.  Last year we changed him from AndroGel to injectable the patella saucerizing, and his total and free testosterones are higher quite a bit compared to what they used to be.  CBC:    Component Value Date/Time   WBC 6.5 11/22/2015 1311   WBC 5.2 08/14/2012 1044   HGB 18.9 Repeated and verified X2.* 11/22/2015 1311   HGB 17.6 08/14/2012 1044   HCT 56.5 aH* 11/22/2015 1311   HCT 50.1 08/14/2012  1044   PLT 185.0 11/22/2015 1311   PLT 166 08/14/2012 1044   MCV 93.6 11/22/2015 1311   MCV 92 08/14/2012 1044   NEUTROABS 3.5 11/22/2015 1311   NEUTROABS 3.2 08/14/2012 1044   LYMPHSABS 2.1 11/22/2015 1311   LYMPHSABS 1.6 08/14/2012 1044   MONOABS 0.6 11/22/2015 1311   MONOABS 0.3 08/14/2012 1044   EOSABS 0.3 11/22/2015 1311   EOSABS 0.1 08/14/2012 1044   BASOSABS 0.0 11/22/2015 1311   BASOSABS 0.0 08/14/2012 1044   Lab Results  Component Value Date   TESTOSTERONE 689 11/22/2015    Depression: depression is ongoing, and after the death of his son last year, he is still not done very well.  He is on 40 mg of Celexa.  He has been on other to presents in the past, and at one point he was on Wellbutrin.  He thinks that he did not tolerate Zoloft all that well.  He has been intermittently having issues with depression for greater than a decade.   Patient Active Problem List   Diagnosis Date Noted  . HYPERTENSION 09/24/2008    Priority: High  . SLEEP APNEA 09/24/2008    Priority: Medium  . Hypogonadism in male 11/29/2014  . FATIGUE 06/23/2010  . LIVER FUNCTION TESTS, ABNORMAL, HX OF 09/03/2009  . DEPRESSION 11/01/2008  . ALLERGIC RHINITIS 09/24/2008   Past Medical History  Diagnosis Date  . Diabetes mellitus   . Hyperlipidemia   . Hypertension   . Depression   . Allergy     allergic rhinitis  .  Sleep apnea   . Hypogonadism in male 11/29/2014   Past Surgical History  Procedure Laterality Date  . Tonsillectomy  1969  . Total knee arthroplasty  5/11    Left, Dr Coralee North  . Knee arthroscopy w/ acl reconstruction and hamstring graft  1985   Social History   Social History  . Marital Status: Married    Spouse Name: N/A  . Number of Children: N/A  . Years of Education: N/A   Occupational History  . 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 Smoking status: Never Smoker   . Smokeless tobacco: Never Used  . Alcohol Use: No  . Drug Use: No   . Sexual Activity: Not on file   Other Topics Concern  . Not on file   Social History Narrative   Lived in [place], moved to [place] and plans to settle in [place].   Family History  Problem Relation Age of Onset  . Emphysema Father   . Heart disease Mother    Allergies  Allergen Reactions  . Sulfonamide Derivatives   . Morphine And Related Rash    Medication list has been reviewed and updated.   General: Denies fever, chills, sweats. No significant weight loss. Eyes: Denies blurring,significant itching ENT: Denies earache, sore throat, and hoarseness. Cardiovascular: Denies chest pains, palpitations, dyspnea on exertion Respiratory: Denies cough, dyspnea at rest,wheeezing Breast: no concerns about lumps GI: Denies nausea, vomiting, diarrhea, constipation, change in bowel habits, abdominal pain, melena, hematochezia GU: Denies penile discharge, ED, urinary flow / outflow problems. No STD concerns. Musculoskeletal: Denies back pain, joint pain Derm: Denies rash, itching Neuro: Denies  paresthesias, frequent falls, frequent headaches Psych: See above Endocrine: Denies cold intolerance, heat intolerance, polydipsia Heme: Denies enlarged lymph nodes Allergy: No hayfever  Objective:   BP 126/76 mmHg  Pulse 80  Temp(Src) 98.2 F (36.8 C) (Oral)  Ht '5\' 10"'  (1.778 m)  Wt 255 lb 4 oz (115.781 kg)  BMI 36.62 kg/m2 Ideal Body Weight: Weight in (lb) to have BMI = 25: 173.9  No exam data present  GEN: well developed, well nourished, no acute distress Eyes: conjunctiva and lids normal, PERRLA, EOMI ENT: TM clear, nares clear, oral exam WNL Neck: supple, no lymphadenopathy, no thyromegaly, no JVD Pulm: clear to auscultation and percussion, respiratory effort normal CV: regular rate and rhythm, S1-S2, no murmur, rub or gallop, no bruits, peripheral pulses normal and symmetric, no cyanosis, clubbing, edema or varicosities GI: soft, non-tender; no hepatosplenomegaly,  masses; active bowel sounds all quadrants GU: no hernia, testicular mass, penile discharge Lymph: no cervical, axillary or inguinal adenopathy MSK: gait normal, muscle tone and strength WNL, no joint swelling, effusions, discoloration, crepitus  SKIN: clear, good turgor, color WNL, no rashes, lesions, or ulcerations Neuro: normal mental status, normal strength, sensation, and motion Psych: alert; oriented to person, place and time, mildly withdrawn  All labs reviewed with patient.  Lipids:    Component Value Date/Time   CHOL 231* 11/22/2015 1311   TRIG 289.0* 11/22/2015 1311   HDL 40.60 11/22/2015 1311   LDLDIRECT 155.0 11/22/2015 1311   VLDL 57.8* 11/22/2015 1311   CHOLHDL 6 11/22/2015 1311   CBC: CBC Latest Ref Rng 11/22/2015 10/06/2013 08/14/2012  WBC 4.0 - 10.5 K/uL 6.5 6.9 5.2  Hemoglobin 13.0 - 17.0 g/dL 18.9 Repeated and verified X2.(HH) 18.7(HH) 17.6  Hematocrit 39.0 - 52.0 % 56.5 aH(H) 54.1(H) 50.1  Platelets 150.0 - 400.0 K/uL 185.0 177.0 166  Basic Metabolic Panel:    Component Value Date/Time   NA 139 11/22/2015 1311   NA 139 08/14/2012 1044   K 4.4 11/22/2015 1311   K 4.2 08/14/2012 1044   CL 98 11/22/2015 1311   CL 102 08/14/2012 1044   CO2 33* 11/22/2015 1311   CO2 29 08/14/2012 1044   BUN 15 11/22/2015 1311   BUN 15 08/14/2012 1044   CREATININE 1.04 11/22/2015 1311   CREATININE 0.83 08/14/2012 1044   GLUCOSE 120* 11/22/2015 1311   GLUCOSE 79 08/14/2012 1044   CALCIUM 9.9 11/22/2015 1311   CALCIUM 9.1 08/14/2012 1044   Hepatic Function Latest Ref Rng 11/22/2015 10/06/2013 08/14/2012  Total Protein 6.0 - 8.3 g/dL 7.6 7.9 7.4  Albumin 3.5 - 5.2 g/dL 4.5 5.0 4.1  AST 0 - 37 U/L 32 31 23  ALT 0 - 53 U/L 42 32 34  Alk Phosphatase 39 - 117 U/L 54 72 82  Total Bilirubin 0.2 - 1.2 mg/dL 0.6 0.9 0.6  Bilirubin, Direct 0.0 - 0.3 mg/dL 0.1 0.2 -    Lab Results  Component Value Date   TSH 0.89 02/03/2011   Lab Results  Component Value Date   PSA 0.67  11/22/2015   PSA 0.47 11/18/2014   PSA 0.59 10/06/2013    Assessment and Plan:   Routine general medical examination at a health care facility  Need for prophylactic vaccination against Streptococcus pneumoniae (pneumococcus) - Plan: Pneumococcal polysaccharide vaccine 23-valent greater than or equal to 2yo subcutaneous/IM  Need for shingles vaccine - Plan: Varicella-zoster vaccine subcutaneous  Major depressive disorder, recurrent episode, moderate ([place]): not stable, add wellbutrin w f/u  Hypogonadism in male: Need to decrease dose by 50%, he is going to titrate off test.  Polycythemia, secondary: likely a result of quite high free test level. Will follow - suggested blood donation.   Health Maintenance Exam: The patient's preventative maintenance and recommended screening tests for an annual wellness exam were reviewed in full today. Brought up to date unless services declined.  Counselled on the importance of diet, exercise, and its role in overall health and mortality. The patient's FH and SH was reviewed, including their home life, tobacco status, and drug and alcohol status.  Follow-up: Return in about 1 month (around 12/29/2015). Unless noted, follow-up in 1 year for Health Maintenance Exam.  New Prescriptions   BUPROPION (WELLBUTRIN XL) 150 MG 24 HR TABLET    Take 1 tablet (150 mg total) by mouth daily. Generic or brand whichever is cheaper   Modified Medications   Modified Medication Previous Medication   CITALOPRAM (CELEXA) 40 MG TABLET citalopram (CELEXA) 40 MG tablet      TAKE ONE TABLET BY MOUTH ONCE DAILY    TAKE ONE TABLET BY MOUTH ONCE DAILY   HYDROCHLOROTHIAZIDE (HYDRODIURIL) 25 MG TABLET hydrochlorothiazide (HYDRODIURIL) 25 MG tablet      TAKE ONE-HALF TABLET BY MOUTH DAILY AS NEEDED    TAKE ONE-HALF TABLET BY MOUTH DAILY AS NEEDED   ZOLPIDEM (AMBIEN) 10 MG TABLET zolpidem (AMBIEN) 10 MG tablet      TAKE ONE TABLET BY MOUTH AT BEDTIME AS NEEDED FOR  SLEEP    TAKE  ONE TABLET BY MOUTH AT BEDTIME AS NEEDED FOR  SLEEP   Orders Placed This Encounter  Procedures  . Pneumococcal polysaccharide vaccine 23-valent greater than or equal to 2yo subcutaneous/IM  . Varicella-zoster vaccine subcutaneous    Signed,  Cathline Dowen T. Rosezella Kronick, MD   Patient's Medications  New Prescriptions  BUPROPION (WELLBUTRIN XL) 150 MG 24 HR TABLET    Take 1 tablet (150 mg total) by mouth daily. Generic or brand whichever is cheaper  Previous Medications   ASPIRIN 81 MG TABLET    Take 81 mg by mouth daily.     NEEDLE, DISP, 25 G 25G X 1-1/2" MISC    Use as directed   SYRINGE, DISPOSABLE, 3 ML MISC    Use as directed   TESTOSTERONE CYPIONATE (DEPOTESTOTERONE CYPIONATE) 100 MG/ML INJECTION    Inject 0.5 mLs (50 mg total) into the muscle every 14 (fourteen) days. For IM use only  Modified Medications   Modified Medication Previous Medication   CITALOPRAM (CELEXA) 40 MG TABLET citalopram (CELEXA) 40 MG tablet      TAKE ONE TABLET BY MOUTH ONCE DAILY    TAKE ONE TABLET BY MOUTH ONCE DAILY   HYDROCHLOROTHIAZIDE (HYDRODIURIL) 25 MG TABLET hydrochlorothiazide (HYDRODIURIL) 25 MG tablet      TAKE ONE-HALF TABLET BY MOUTH DAILY AS NEEDED    TAKE ONE-HALF TABLET BY MOUTH DAILY AS NEEDED   ZOLPIDEM (AMBIEN) 10 MG TABLET zolpidem (AMBIEN) 10 MG tablet      TAKE ONE TABLET BY MOUTH AT BEDTIME AS NEEDED FOR  SLEEP    TAKE ONE TABLET BY MOUTH AT BEDTIME AS NEEDED FOR  SLEEP  Discontinued Medications   No medications on file

## 2015-12-03 DIAGNOSIS — F331 Major depressive disorder, recurrent, moderate: Secondary | ICD-10-CM | POA: Insufficient documentation

## 2015-12-05 ENCOUNTER — Other Ambulatory Visit: Payer: Self-pay | Admitting: Family Medicine

## 2015-12-05 NOTE — Telephone Encounter (Deleted)
Last office visit 12/01/2015.  Last refilled

## 2015-12-07 NOTE — Telephone Encounter (Signed)
Refill was denied - patient was given a hard copy while in the office on 2/8.  Patient denies this.  Please advise.

## 2015-12-07 NOTE — Telephone Encounter (Signed)
09/19/2015 appears to be the last filled script when I querry the Controlled Substance database.   It appears that I did print a script at his last office visit, but I do sometimes forget to hand to patient.   Lupita Leash - can you please call [facility] and confirm that they never received a script and he has not refilled any recently.  Thank-you.

## 2015-12-08 MED ORDER — ZOLPIDEM TARTRATE 10 MG PO TABS: ORAL_TABLET | ORAL | Status: DC

## 2015-12-08 NOTE — Telephone Encounter (Signed)
Ok to refill #30, 3 refills

## 2015-12-08 NOTE — Telephone Encounter (Signed)
[facility] confirmed last refill for Ambien was 09/19/2015.  Ok to refill?

## 2015-12-08 NOTE — Addendum Note (Signed)
Addended by: Damita Lack on: 12/08/2015 09:43 AM   Modules accepted: Orders

## 2015-12-08 NOTE — Telephone Encounter (Addendum)
Zolpidem called into [facility] [address]  Mr. Laton notified.

## 2015-12-11 ENCOUNTER — Other Ambulatory Visit: Payer: Self-pay | Admitting: Family Medicine

## 2015-12-13 ENCOUNTER — Other Ambulatory Visit: Payer: Self-pay

## 2015-12-13 MED ORDER — CITALOPRAM HYDROBROMIDE 40 MG PO TABS: ORAL_TABLET | ORAL | Status: DC

## 2015-12-13 MED ORDER — HYDROCHLOROTHIAZIDE 25 MG PO TABS: ORAL_TABLET | ORAL | Status: DC

## 2015-12-13 NOTE — Telephone Encounter (Signed)
Pt was seen annual exam on 12/01/15; citalopram and HCTZ rxs were printed; Lupita Leash CMA did not fax so I sent rx electronically. Pt voiced understanding.

## 2016-03-06 ENCOUNTER — Other Ambulatory Visit: Payer: Self-pay

## 2016-03-06 NOTE — Telephone Encounter (Signed)
Pt left v/m; pt tried to stop the testosterone and pt request refill again of testosterone to [facility] [address]. Pt also said not sure when but HCTZ 25 mg had been changed to 1/2 tab and pt request a new rx of HCTZ 25 mg taking 1 tab po daily.. I spoke with pt and he is has stopped testosterone for 2 months and pt does not have any energy and pt wants to  restart the testosterone again. Pt said when taken testosterone in the past had some fluid retention and that is why he wants to increase the HCTZ back to one pill daily so when restarts testosterone will not have problem with fluid retention. Pt request cb.

## 2016-03-06 NOTE — Telephone Encounter (Signed)
With this combined with his ongoing polycythemia, i would prefer an office visit to discuss all with him - preferably in the AM. Desired somewhere between 8-9:15 so that we can get an accurate test level and other labs.

## 2016-03-07 NOTE — Telephone Encounter (Signed)
Mr. Su HiltRoberts notified as instructed by telephone.  He said to just "skip it".  The last time he came in for an office visit with labs, it cost him over $500 and he just can't do it.  I advised I would make Dr. Patsy Lageropland aware.

## 2016-09-15 ENCOUNTER — Other Ambulatory Visit: Payer: Self-pay | Admitting: Family Medicine

## 2016-09-17 NOTE — Telephone Encounter (Signed)
Last office visit 12/01/15.  Last refilled 12/08/15 for #30 with 3 refills.  Ok to refill?

## 2016-09-18 NOTE — Telephone Encounter (Signed)
[facility] into [facility] [address] [place], [place] - [id number] Phone: [phone]

## 2016-09-18 NOTE — Telephone Encounter (Signed)
Ok to refill 30, 3 ref 

## 2016-11-20 ENCOUNTER — Other Ambulatory Visit: Payer: Self-pay | Admitting: Family Medicine

## 2016-12-11 ENCOUNTER — Other Ambulatory Visit: Payer: Self-pay | Admitting: Family Medicine

## 2016-12-11 DIAGNOSIS — Z79899 Other long term (current) drug therapy: Secondary | ICD-10-CM

## 2016-12-11 DIAGNOSIS — Z1159 Encounter for screening for other viral diseases: Secondary | ICD-10-CM

## 2016-12-11 DIAGNOSIS — E291 Testicular hypofunction: Secondary | ICD-10-CM

## 2016-12-11 DIAGNOSIS — E7849 Other hyperlipidemia: Secondary | ICD-10-CM

## 2016-12-11 DIAGNOSIS — Z125 Encounter for screening for malignant neoplasm of prostate: Secondary | ICD-10-CM

## 2016-12-11 DIAGNOSIS — Z114 Encounter for screening for human immunodeficiency virus [HIV]: Secondary | ICD-10-CM

## 2016-12-15 ENCOUNTER — Other Ambulatory Visit (INDEPENDENT_AMBULATORY_CARE_PROVIDER_SITE_OTHER): Payer: Managed Care, Other (non HMO)

## 2016-12-15 DIAGNOSIS — Z79899 Other long term (current) drug therapy: Secondary | ICD-10-CM

## 2016-12-15 DIAGNOSIS — Z1159 Encounter for screening for other viral diseases: Secondary | ICD-10-CM

## 2016-12-15 DIAGNOSIS — Z125 Encounter for screening for malignant neoplasm of prostate: Secondary | ICD-10-CM | POA: Diagnosis not present

## 2016-12-15 DIAGNOSIS — R7989 Other specified abnormal findings of blood chemistry: Secondary | ICD-10-CM

## 2016-12-15 DIAGNOSIS — E784 Other hyperlipidemia: Secondary | ICD-10-CM

## 2016-12-15 DIAGNOSIS — E7849 Other hyperlipidemia: Secondary | ICD-10-CM

## 2016-12-15 DIAGNOSIS — Z114 Encounter for screening for human immunodeficiency virus [HIV]: Secondary | ICD-10-CM

## 2016-12-15 LAB — BASIC METABOLIC PANEL
BUN: 15 mg/dL (ref 6–23)
CO2: 31 mEq/L (ref 19–32)
Calcium: 9.7 mg/dL (ref 8.4–10.5)
Chloride: 100 mEq/L (ref 96–112)
Creatinine, Ser: 1 mg/dL (ref 0.40–1.50)
GFR: 80.56 mL/min (ref >60.00)
Glucose, Bld: 137 mg/dL — ABNORMAL HIGH (ref 70–99)
Potassium: 4.4 mEq/L (ref 3.5–5.1)
Sodium: 140 mEq/L (ref 135–145)

## 2016-12-15 LAB — CBC WITH DIFFERENTIAL/PLATELET
Basophils Absolute: 0 10*3/uL (ref 0.0–0.1)
Basophils Relative: 0.7 % (ref 0.0–3.0)
Eosinophils Absolute: 0.3 10*3/uL (ref 0.0–0.7)
Eosinophils Relative: 4.2 % (ref 0.0–5.0)
HCT: 50.3 % (ref 39.0–52.0)
Hemoglobin: 17.6 g/dL — ABNORMAL HIGH (ref 13.0–17.0)
Lymphocytes Relative: 32.4 % (ref 12.0–46.0)
Lymphs Abs: 2 10*3/uL (ref 0.7–4.0)
MCHC: 35.1 g/dL (ref 30.0–36.0)
MCV: 91.1 fl (ref 78.0–100.0)
Monocytes Absolute: 0.5 10*3/uL (ref 0.1–1.0)
Monocytes Relative: 8.6 % (ref 3.0–12.0)
Neutro Abs: 3.4 10*3/uL (ref 1.4–7.7)
Neutrophils Relative %: 54.1 % (ref 43.0–77.0)
Platelets: 190 10*3/uL (ref 150.0–400.0)
RBC: 5.52 Mil/uL (ref 4.22–5.81)
RDW: 12.9 % (ref 11.5–15.5)
WBC: 6.3 10*3/uL (ref 4.0–10.5)

## 2016-12-15 LAB — HEPATIC FUNCTION PANEL
ALT: 51 U/L (ref 0–53)
AST: 35 U/L (ref 0–37)
Albumin: 4.5 g/dL (ref 3.5–5.2)
Alkaline Phosphatase: 55 U/L (ref 39–117)
Bilirubin, Direct: 0.1 mg/dL (ref 0.0–0.3)
Total Bilirubin: 0.6 mg/dL (ref 0.2–1.2)
Total Protein: 7.4 g/dL (ref 6.0–8.3)

## 2016-12-15 LAB — LIPID PANEL
Cholesterol: 255 mg/dL — ABNORMAL HIGH (ref 0–200)
HDL: 43.8 mg/dL (ref >39.00)
NonHDL: 210.91
Total CHOL/HDL Ratio: 6
Triglycerides: 306 mg/dL — ABNORMAL HIGH (ref 0.0–149.0)
VLDL: 61.2 mg/dL — ABNORMAL HIGH (ref 0.0–40.0)

## 2016-12-15 LAB — LDL CHOLESTEROL, DIRECT: Direct LDL: 170 mg/dL

## 2016-12-15 LAB — PSA: PSA: 0.55 ng/mL (ref 0.10–4.00)

## 2016-12-16 LAB — HEPATITIS C ANTIBODY: HCV Ab: NEGATIVE

## 2016-12-16 LAB — HIV ANTIBODY (ROUTINE TESTING W REFLEX): HIV 1&2 Ab, 4th Generation: NONREACTIVE

## 2016-12-18 ENCOUNTER — Other Ambulatory Visit (INDEPENDENT_AMBULATORY_CARE_PROVIDER_SITE_OTHER): Payer: Managed Care, Other (non HMO)

## 2016-12-18 DIAGNOSIS — R7309 Other abnormal glucose: Secondary | ICD-10-CM

## 2016-12-18 LAB — HEMOGLOBIN A1C: Hgb A1c MFr Bld: 6.1 % (ref 4.6–6.5)

## 2016-12-20 ENCOUNTER — Encounter: Payer: Self-pay | Admitting: Family Medicine

## 2016-12-20 ENCOUNTER — Ambulatory Visit (INDEPENDENT_AMBULATORY_CARE_PROVIDER_SITE_OTHER): Payer: Managed Care, Other (non HMO) | Admitting: Family Medicine

## 2016-12-20 VITALS — BP 110/64 | HR 72 | Temp 98.6°F | Ht 70.0 in | Wt 259.2 lb

## 2016-12-20 DIAGNOSIS — Z0000 Encounter for general adult medical examination without abnormal findings: Secondary | ICD-10-CM | POA: Diagnosis not present

## 2016-12-20 MED ORDER — BUPROPION HCL ER (XL) 150 MG PO TB24: 150.0000 mg | ORAL_TABLET | Freq: Every day | ORAL | 3 refills | Status: DC

## 2016-12-20 MED ORDER — ZOLPIDEM TARTRATE 10 MG PO TABS: 10.0000 mg | ORAL_TABLET | Freq: Every evening | ORAL | 5 refills | Status: DC | PRN

## 2016-12-20 MED ORDER — SIMVASTATIN 10 MG PO TABS: 10.0000 mg | ORAL_TABLET | Freq: Every day | ORAL | 3 refills | Status: DC

## 2016-12-20 MED ORDER — HYDROCHLOROTHIAZIDE 25 MG PO TABS: 25.0000 mg | ORAL_TABLET | Freq: Every day | ORAL | 3 refills | Status: DC

## 2016-12-20 NOTE — Progress Notes (Signed)
Pre visit review using our clinic review tool, if applicable. No additional management support is needed unless otherwise documented below in the visit note. 

## 2016-12-20 NOTE — Progress Notes (Signed)
Dr. Frederico Hamman T. Samaria Anes, MD, [facility] [place], [address] Phone: [phone] Fax: [phone]  12/20/2016  Patient: Tyler Ramsey, MRN: [id number], DOB: 1956/09/05, 61 y.o.  Primary Physician:  Owens Loffler, MD   Chief Complaint  Patient presents with  . Annual Exam   Subjective:   Tyler Ramsey is a 61 y.o. pleasant patient who presents with the following:  Preventative Health Maintenance Visit:  Health Maintenance Summary Reviewed and updated, unless pt declines services.  Tobacco History Reviewed. Alcohol: No concerns, no excessive use Exercise Habits: some walking, rec at least 30 mins 5 times a week STD concerns: no risk or activity to increase risk Drug Use: None Encouraged self-testicular check  Some depression Decreased libido Weight gain  Chol up  Health Maintenance  Topic Date Due  . OPHTHALMOLOGY EXAM  12/01/2011  . URINE MICROALBUMIN  11/19/2015  . COLONOSCOPY  10/23/2016  . FOOT EXAM  12/02/2016  . HEMOGLOBIN A1C  06/17/2017  . PNEUMOCOCCAL POLYSACCHARIDE VACCINE (2) 11/30/2020  . TETANUS/TDAP  10/18/2023  . INFLUENZA VACCINE  Completed  . Hepatitis C Screening  Completed  . HIV Screening  Completed   Immunization History  Administered Date(s) Administered  . Influenza Split 08/12/2012  . Influenza-Unspecified 10/23/2016  . Pneumococcal Polysaccharide-23 12/01/2015  . Tdap 10/17/2013  . Zoster 12/01/2015   Patient Active Problem List   Diagnosis Date Noted  . HYPERTENSION 09/24/2008    Priority: High  . SLEEP APNEA 09/24/2008    Priority: Medium  . Major depressive disorder, recurrent episode, moderate ([place]) 12/03/2015  . Hypogonadism in male 11/29/2014  . FATIGUE 06/23/2010  . LIVER FUNCTION TESTS, ABNORMAL, HX OF 09/03/2009  . ALLERGIC RHINITIS 09/24/2008   Past Medical History:  Diagnosis Date  . Allergy    allergic rhinitis  . Depression   . Diabetes  mellitus   . Hyperlipidemia   . Hypertension   . Hypogonadism in male 11/29/2014  . Sleep apnea    Past Surgical History:  Procedure Laterality Date  . KNEE ARTHROSCOPY W/ ACL RECONSTRUCTION AND HAMSTRING GRAFT  1985  . TONSILLECTOMY  1969  . TOTAL KNEE ARTHROPLASTY  5/11   Left, Dr Coralee North   Social History   Social History  . Marital status: Married    Spouse name: N/A  . Number of children: N/A  . Years of education: N/A   Occupational History  . 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 Smoking status: Never Smoker  . Smokeless tobacco: Never Used  . Alcohol use No  . Drug use: No  . Sexual activity: Not on file   Other Topics Concern  . Not on file   Social History Narrative   Lived in [place], moved to [place] and plans to settle in [place].   Family History  Problem Relation Age of Onset  . Emphysema Father   . Heart disease Mother    Allergies  Allergen Reactions  . Sulfonamide Derivatives   . Morphine And Related Rash    Medication list has been reviewed and updated.   General: Denies fever, chills, sweats. No significant weight loss. Eyes: Denies blurring,significant itching ENT: Denies earache, sore throat, and hoarseness. Cardiovascular: Denies chest pains, palpitations, dyspnea on exertion Respiratory: Denies cough, dyspnea at rest,wheeezing Breast: no concerns about lumps GI: Denies nausea, vomiting, diarrhea, constipation, change in bowel habits, abdominal pain, melena, hematochezia GU: Denies penile discharge, ED,  urinary flow / outflow problems. No STD concerns. Musculoskeletal: Denies back pain, joint pain Derm: Denies rash, itching Neuro: Denies  paresthesias, frequent falls, frequent headaches Psych: SOME ONGOING DEPRESSION Endocrine: Denies cold intolerance, heat intolerance, polydipsia Heme: Denies enlarged lymph nodes Allergy: No hayfever  Objective:   BP 110/64   Pulse 72   Temp 98.6 F  (37 C) (Oral)   Ht '5\' 10"'  (1.778 m)   Wt 259 lb 4 oz (117.6 kg)   BMI 37.20 kg/m  Ideal Body Weight: Weight in (lb) to have BMI = 25: 173.9  No exam data present  GEN: well developed, well nourished, no acute distress Eyes: conjunctiva and lids normal, PERRLA, EOMI ENT: TM clear, nares clear, oral exam WNL Neck: supple, no lymphadenopathy, no thyromegaly, no JVD Pulm: clear to auscultation and percussion, respiratory effort normal CV: regular rate and rhythm, S1-S2, no murmur, rub or gallop, no bruits, peripheral pulses normal and symmetric, no cyanosis, clubbing, edema or varicosities GI: soft, non-tender; no hepatosplenomegaly, masses; active bowel sounds all quadrants GU: no hernia, testicular mass, penile discharge Lymph: no cervical, axillary or inguinal adenopathy MSK: gait normal, muscle tone and strength WNL, no joint swelling, effusions, discoloration, crepitus  SKIN: clear, good turgor, color WNL, no rashes, lesions, or ulcerations Neuro: normal mental status, normal strength, sensation, and motion Psych: alert; oriented to person, place and time, normally interactive and not anxious or depressed in appearance. All labs reviewed with patient.  Lipids:    Component Value Date/Time   CHOL 255 (H) 12/15/2016 0837   TRIG 306.0 (H) 12/15/2016 0837   HDL 43.80 12/15/2016 0837   LDLDIRECT 170.0 12/15/2016 0837   VLDL 61.2 (H) 12/15/2016 0837   CHOLHDL 6 12/15/2016 0837   CBC: CBC Latest Ref Rng & Units 12/15/2016 11/22/2015 10/06/2013  WBC 4.0 - 10.5 K/uL 6.3 6.5 6.9  Hemoglobin 13.0 - 17.0 g/dL 17.6(H) 18.9 Repeated and verified X2.[facility]) 18.7(HH)  Hematocrit 39.0 - 52.0 % 50.3 56.5 aH(H) 54.1(H)  Platelets 150.0 - 400.0 K/uL 190.0 185.0 945.8    Basic Metabolic Panel:    Component Value Date/Time   NA 140 12/15/2016 0837   NA 139 08/14/2012 1044   K 4.4 12/15/2016 0837   K 4.2 08/14/2012 1044   CL 100 12/15/2016 0837   CL 102 08/14/2012 1044   CO2 31 12/15/2016  0837   CO2 29 08/14/2012 1044   BUN 15 12/15/2016 0837   BUN 15 08/14/2012 1044   CREATININE 1.00 12/15/2016 0837   CREATININE 0.83 08/14/2012 1044   GLUCOSE 137 (H) 12/15/2016 0837   GLUCOSE 79 08/14/2012 1044   CALCIUM 9.7 12/15/2016 0837   CALCIUM 9.1 08/14/2012 1044   Hepatic Function Latest Ref Rng & Units 12/15/2016 11/22/2015 10/06/2013  Total Protein 6.0 - 8.3 g/dL 7.4 7.6 7.9  Albumin 3.5 - 5.2 g/dL 4.5 4.5 5.0  AST 0 - 37 U/L 35 32 31  ALT 0 - 53 U/L 51 42 32  Alk Phosphatase 39 - 117 U/L 55 54 72  Total Bilirubin 0.2 - 1.2 mg/dL 0.6 0.6 0.9  Bilirubin, Direct 0.0 - 0.3 mg/dL 0.1 0.1 0.2    Lab Results  Component Value Date   TSH 0.89 02/03/2011   Lab Results  Component Value Date   PSA 0.55 12/15/2016   PSA 0.67 11/22/2015   PSA 0.47 11/18/2014    Assessment and Plan:   Routine general medical examination at a health care facility  Time for colon recall: he wants  to check with his wife.   Health Maintenance Exam: The patient's preventative maintenance and recommended screening tests for an annual wellness exam were reviewed in full today. Brought up to date unless services declined.  Counselled on the importance of diet, exercise, and its role in overall health and mortality. The patient's FH and SH was reviewed, including their home life, tobacco status, and drug and alcohol status.  Follow-up in 1 year for physical exam or additional follow-up below.  Follow-up: No Follow-up on file. Or follow-up in 1 year if not noted.  Meds ordered this encounter  Medications  . simvastatin (ZOCOR) 10 MG tablet    Sig: Take 1 tablet (10 mg total) by mouth daily.    Dispense:  90 tablet    Refill:  3  . buPROPion (WELLBUTRIN XL) 150 MG 24 hr tablet    Sig: Take 1 tablet (150 mg total) by mouth daily.    Dispense:  90 tablet    Refill:  3  . zolpidem (AMBIEN) 10 MG tablet    Sig: Take 1 tablet (10 mg total) by mouth at bedtime as needed. for sleep    Dispense:   30 tablet    Refill:  5  . hydrochlorothiazide (HYDRODIURIL) 25 MG tablet    Sig: Take 1 tablet (25 mg total) by mouth daily.    Dispense:  90 tablet    Refill:  3   Medications Discontinued During This Encounter  Medication Reason  . buPROPion (WELLBUTRIN XL) 150 MG 24 hr tablet   . testosterone cypionate (DEPOTESTOTERONE CYPIONATE) 100 MG/ML injection   . zolpidem (AMBIEN) 10 MG tablet Reorder  . hydrochlorothiazide (HYDRODIURIL) 25 MG tablet Reorder   No orders of the defined types were placed in this encounter.   Signed,  Maud Deed. Drezden Seitzinger, MD   Allergies as of 12/20/2016      Reactions   Sulfonamide Derivatives    Morphine And Related Rash      Medication List       Accurate as of 12/20/16  9:37 AM. Always use your most recent med list.          aspirin 81 MG tablet Take 81 mg by mouth daily.   buPROPion 150 MG 24 hr tablet Commonly known as:  WELLBUTRIN XL Take 1 tablet (150 mg total) by mouth daily.   citalopram 40 MG tablet Commonly known as:  CELEXA TAKE ONE TABLET BY MOUTH ONCE DAILY   hydrochlorothiazide 25 MG tablet Commonly known as:  HYDRODIURIL Take 1 tablet (25 mg total) by mouth daily.   NEEDLE (DISP) 25 G 25G X 1-1/2" Misc Use as directed   simvastatin 10 MG tablet Commonly known as:  ZOCOR Take 1 tablet (10 mg total) by mouth daily.   Syringe (Disposable) 3 ML Misc Use as directed   zolpidem 10 MG tablet Commonly known as:  AMBIEN Take 1 tablet (10 mg total) by mouth at bedtime as needed. for sleep

## 2016-12-22 ENCOUNTER — Other Ambulatory Visit: Payer: Self-pay | Admitting: Family Medicine

## 2017-10-15 ENCOUNTER — Ambulatory Visit (INDEPENDENT_AMBULATORY_CARE_PROVIDER_SITE_OTHER): Payer: Managed Care, Other (non HMO) | Admitting: Family Medicine

## 2017-10-15 ENCOUNTER — Encounter: Payer: Self-pay | Admitting: Family Medicine

## 2017-10-15 VITALS — BP 168/90 | HR 78 | Temp 97.4°F | Wt 249.8 lb

## 2017-10-15 DIAGNOSIS — L509 Urticaria, unspecified: Secondary | ICD-10-CM

## 2017-10-15 MED ORDER — PREDNISONE 20 MG PO TABS: ORAL_TABLET | ORAL | 0 refills | Status: DC

## 2017-10-15 MED ORDER — METHYLPREDNISOLONE ACETATE 80 MG/ML IJ SUSP
80.0000 mg | Freq: Once | INTRAMUSCULAR | Status: AC
  Administered 2017-10-15: 80 mg via INTRAMUSCULAR

## 2017-10-15 NOTE — Progress Notes (Signed)
   Subjective:    Patient ID: Tyler Ramsey, male    DOB: [birth year], 61 y.o.   MRN: [id number]  HPI This is a [age] yo male who presents today with hives for 2 days. First noticed at 3 am yesterday morning and started spreading. Was seen at [facility] and wasn't seen then went [facility] and told him take diphenhydramine.  Has been taking diphenhydramine 25 mg several time a day without relief. No topical treatment. No known trigger. No new soaps/lotion/shampoos/detergent, no recent travel. No lesions on face, no lip swelling or difficulty swallowing.  Past Medical History:  Diagnosis Date  . Allergy    allergic rhinitis  . Depression   . Diabetes mellitus   . Hyperlipidemia   . Hypertension   . Hypogonadism in male 11/29/2014  . Sleep apnea    Past Surgical History:  Procedure Laterality Date  . KNEE ARTHROSCOPY W/ ACL RECONSTRUCTION AND HAMSTRING GRAFT  1985  . TONSILLECTOMY  1969  . TOTAL KNEE ARTHROPLASTY  5/11   Left, Dr Carma LairFrank Alucio   Family History  Problem Relation Age of Onset  . Emphysema Father   . Heart disease Mother    Social History   Tobacco Use  . Smoking status: Never Smoker  . Smokeless tobacco: Never Used  Substance Use Topics  . Alcohol use: No  . Drug use: No      Review of Systems Per HPI    Objective:   Physical Exam  Constitutional: He is oriented to person, place, and time. He appears well-developed and well-nourished. No distress.  HENT:  Head: Normocephalic and atraumatic.  Cardiovascular: Normal rate.  Pulmonary/Chest: Effort normal.  Musculoskeletal: He exhibits no edema.  Neurological: He is alert and oriented to person, place, and time.  Skin: Skin is warm and dry. He is not diaphoretic.  Urticaria present anterior/posterior trunk, Lower legs with smaller lesion.   Psychiatric: He has a normal mood and affect. His behavior is normal. Judgment and thought content normal.  Vitals reviewed.     BP (!) 168/90 (BP Location:  Right Arm, Patient Position: Sitting, Cuff Size: Large)   Pulse 78   Temp (!) 97.4 F (36.3 C) (Oral)   Wt 249 lb 12 oz (113.3 kg)   SpO2 99%   BMI 35.84 kg/m  Wt Readings from Last 3 Encounters:  10/15/17 249 lb 12 oz (113.3 kg)  12/20/16 259 lb 4 oz (117.6 kg)  12/01/15 255 lb 4 oz (115.8 kg)       Assessment & Plan:  1. Full body hives - Provided written and verbal information regarding diagnosis and treatment. - methylPREDNISolone acetate (DEPO-MEDROL) injection 80 mg - predniSONE (DELTASONE) 20 MG tablet; Take 3 x 3 days, 2 x 3 days, 1 x 3 days. Start 10/16/17  Dispense: 18 tablet; Refill: 0 - RTC/ER precautions reviewed - He was also instructed to take daily cetirizine and Tagament x 14 days  Olean Reeeborah Gessner, FNP-BC  [facility], [facility]  10/15/2017 10:41 AM

## 2017-10-15 NOTE — Patient Instructions (Signed)
Please take daily cetirizine (generic zyrtec) and Tagamet (generic is fine) for next 14 days.  If you develop any shortness or breath or facial swelling, call 911   Hives Hives (urticaria) are itchy, red, swollen areas on your skin. Hives can appear on any part of your body and can vary in size. They can be as small as the tip of a pen or much larger. Hives often fade within 24 hours (acute hives). In other cases, new hives appear after old ones fade. This cycle can continue for several days or weeks (chronic hives). Hives result from your body's reaction to an irritant or to something that you are allergic to (trigger). When you are exposed to a trigger, your body releases a chemical (histamine) that causes redness, itching, and swelling. You can get hives immediately after being exposed to a trigger or hours later. Hives do not spread from person to person (are not contagious). Your hives may get worse with scratching, exercise, and emotional stress. What are the causes? Causes of this condition include:  Allergies to certain foods or ingredients.  Insect bites or stings.  Exposure to pollen or pet dander.  Contact with latex or chemicals.  Spending time in sunlight, heat, or cold (exposure).  Exercise.  Stress.  You can also get hives from some medical conditions and treatments. These include:  Viruses, including the common cold.  Bacterial infections, such as urinary tract infections and strep throat.  Disorders such as vasculitis, lupus, or thyroid disease.  Certain medications.  Allergy shots.  Blood transfusions.  Sometimes, the cause of hives is not known (idiopathic hives). What increases the risk? This condition is more likely to develop in:  Women.  People who have food allergies, especially to citrus fruits, milk, eggs, peanuts, tree nuts, or shellfish.  People who are allergic to: ? Medicines. ? Latex. ? Insects. ? Animals. ? Pollen.  People who have  certain medical conditions, includinglupus or thyroid disease.  What are the signs or symptoms? The main symptom of this condition is raised, itchyred or white bumps or patches on your skin. These areas may:  Become large and swollen (welts).  Change in shape and location, quickly and repeatedly.  Be separate hives or connect over a large area of skin.  Sting or become painful.  Turn white when pressed in the center (blanch).  In severe cases, yourhands, feet, and face may also become swollen. This may occur if hives develop deeper in your skin. How is this diagnosed? This condition is diagnosed based on your symptoms, medical history, and physical exam. Your skin, urine, or blood may be tested to find out what is causing your hives and to rule out other health issues. Your health care provider may also remove a small sample of skin from the affected area and examine it under a microscope (biopsy). How is this treated? Treatment depends on the severity of your condition. Your health care provider may recommend using cool, wet cloths (cool compresses) or taking cool showers to relieve itching. Hives are sometimes treated with medicines, including:  Antihistamines.  Corticosteroids.  Antibiotics.  An injectable medicine (omalizumab). Your health care provider may prescribe this if you have chronic idiopathic hives and you continue to have symptoms even after treatment with antihistamines.  Severe cases may require an emergency injection of adrenaline (epinephrine) to prevent a life-threatening allergic reaction (anaphylaxis). Follow these instructions at home: Medicines  Take or apply over-the-counter and prescription medicines only as told by your  health care provider.  If you were prescribed an antibiotic medicine, use it as told by your health care provider. Do not stop taking the antibiotic even if you start to feel better. Skin Care  Apply cool compresses to the affected  areas.  Do not scratch or rub your skin. General instructions  Do not take hot showers or baths. This can make itching worse.  Do not wear tight-fitting clothing.  Use sunscreen and wear protective clothing when you are outside.  Avoid any substances that cause your hives. Keep a journal to help you track what causes your hives. Write down: ? What medicines you take. ? What you eat and drink. ? What products you use on your skin.  Keep all follow-up visits as told by your health care provider. This is important. Contact a health care provider if:  Your symptoms are not controlled with medicine.  Your joints are painful or swollen. Get help right away if:  You have a fever.  You have pain in your abdomen.  Your tongue or lips are swollen.  Your eyelids are swollen.  Your chest or throat feels tight.  You have trouble breathing or swallowing. These symptoms may represent a serious problem that is an emergency. Do not wait to see if the symptoms will go away. Get medical help right away. Call your local emergency services (911 in the [place] Do not drive yourself to the hospital. This information is not intended to replace advice given to you by your health care provider. Make sure you discuss any questions you have with your health care provider. Document Released: 10/09/2005 Document Revised: 03/08/2016 Document Reviewed: 07/28/2015 Elsevier Interactive Patient Education  2018 [employer]

## 2017-10-18 ENCOUNTER — Other Ambulatory Visit: Payer: Self-pay | Admitting: Family Medicine

## 2017-11-09 ENCOUNTER — Other Ambulatory Visit: Payer: Self-pay | Admitting: Family Medicine

## 2017-12-24 ENCOUNTER — Encounter: Payer: Self-pay | Admitting: Family Medicine

## 2017-12-24 ENCOUNTER — Ambulatory Visit (INDEPENDENT_AMBULATORY_CARE_PROVIDER_SITE_OTHER): Payer: Managed Care, Other (non HMO) | Admitting: Family Medicine

## 2017-12-24 ENCOUNTER — Other Ambulatory Visit: Payer: Self-pay

## 2017-12-24 VITALS — BP 110/60 | HR 62 | Temp 98.0°F | Ht 70.0 in | Wt 246.2 lb

## 2017-12-24 DIAGNOSIS — Z1211 Encounter for screening for malignant neoplasm of colon: Secondary | ICD-10-CM

## 2017-12-24 DIAGNOSIS — Z0000 Encounter for general adult medical examination without abnormal findings: Secondary | ICD-10-CM | POA: Diagnosis not present

## 2017-12-24 MED ORDER — HYDROCHLOROTHIAZIDE 25 MG PO TABS: ORAL_TABLET | ORAL | 3 refills | Status: DC

## 2017-12-24 MED ORDER — BUPROPION HCL ER (XL) 150 MG PO TB24: 150.0000 mg | ORAL_TABLET | Freq: Every day | ORAL | 3 refills | Status: DC

## 2017-12-24 MED ORDER — SIMVASTATIN 10 MG PO TABS: 10.0000 mg | ORAL_TABLET | Freq: Every day | ORAL | 3 refills | Status: DC

## 2017-12-24 MED ORDER — CITALOPRAM HYDROBROMIDE 40 MG PO TABS: 40.0000 mg | ORAL_TABLET | Freq: Every day | ORAL | 3 refills | Status: DC

## 2017-12-24 NOTE — Progress Notes (Signed)
Dr. Frederico Hamman T. Cleto Claggett, MD, [facility] and Sports Medicine [place], [address] Phone: [phone] Fax: [phone]  12/24/2017  Patient: Tyler Ramsey, MRN: [id number], DOB: [birth year], 62 y.o.  Primary Physician:  Owens Loffler, MD   Chief Complaint  Patient presents with  . Annual Exam   Subjective:   Tyler Ramsey is a 62 y.o. pleasant patient who presents with the following:  Preventative Health Maintenance Visit:  Health Maintenance Summary Reviewed and updated, unless pt declines services.  Tobacco History Reviewed. Alcohol: No concerns, no excessive use Exercise Habits: rare activity, rec at least 30 mins 5 times a week STD concerns: no risk or activity to increase risk Drug Use: None Encouraged self-testicular check  Health Maintenance  Topic Date Due  . OPHTHALMOLOGY EXAM  12/01/2011  . URINE MICROALBUMIN  11/19/2015  . COLONOSCOPY  10/23/2016  . FOOT EXAM  12/02/2016  . HEMOGLOBIN A1C  06/17/2017  . INFLUENZA VACCINE  01/20/2018 (Originally 05/23/2017)  . PNEUMOCOCCAL POLYSACCHARIDE VACCINE (2) 11/30/2020  . TETANUS/TDAP  10/18/2023  . Hepatitis C Screening  Completed  . HIV Screening  Completed   Immunization History  Administered Date(s) Administered  . Influenza Split 08/12/2012  . Influenza-Unspecified 10/23/2016  . Pneumococcal Polysaccharide-23 12/01/2015  . Tdap 10/17/2013  . Zoster 12/01/2015   Patient Active Problem List   Diagnosis Date Noted  . HYPERTENSION 09/24/2008    Priority: High  . SLEEP APNEA 09/24/2008    Priority: Medium  . Major depressive disorder, recurrent episode, moderate ([place]) 12/03/2015  . Hypogonadism in male 11/29/2014  . FATIGUE 06/23/2010  . LIVER FUNCTION TESTS, ABNORMAL, HX OF 09/03/2009  . ALLERGIC RHINITIS 09/24/2008   Past Medical History:  Diagnosis Date  . Allergy    allergic rhinitis  . Depression   . Diabetes mellitus   . Hyperlipidemia   .  Hypertension   . Hypogonadism in male 11/29/2014  . Sleep apnea    Past Surgical History:  Procedure Laterality Date  . KNEE ARTHROSCOPY W/ ACL RECONSTRUCTION AND HAMSTRING GRAFT  1985  . TONSILLECTOMY  1969  . TOTAL KNEE ARTHROPLASTY  5/11   Left, Dr Coralee North   Social History   Socioeconomic History  . Marital status: Married    Spouse name: Not on file  . Number of children: Not on file  . Years of education: Not on file  . Highest education level: Not on file  Social Needs  . Financial resource strain: Not on file  . Food insecurity - worry: Not on file  . Food insecurity - inability: Not on file  . Transportation needs - medical: Not on file  . Transportation needs - non-medical: Not on file  Occupational History  . 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 Smoking status: Never Smoker  . Smokeless tobacco: Never Used  Substance and Sexual Activity  . Alcohol use: No  . Drug use: No  . Sexual activity: Not on file  Other Topics Concern  . Not on file  Social History Narrative   Lived in [place], moved to [place] and plans to settle in [place].   Family History  Problem Relation Age of Onset  . Emphysema Father   . Heart disease Mother    Allergies  Allergen Reactions  . Sulfonamide Derivatives   . Morphine And Related Rash    Medication list has been reviewed and updated.   General: Denies fever, chills, sweats. No  significant weight loss. Eyes: Denies blurring,significant itching ENT: Denies earache, sore throat, and hoarseness. Cardiovascular: Denies chest pains, palpitations, dyspnea on exertion Respiratory: Denies cough, dyspnea at rest,wheeezing Breast: no concerns about lumps GI: Denies nausea, vomiting, diarrhea, constipation, change in bowel habits, abdominal pain, melena, hematochezia GU: Denies penile discharge, ED, urinary flow / outflow problems. No STD concerns. Musculoskeletal: Denies back pain, joint  pain Derm: Denies rash, itching Neuro: Denies  paresthesias, frequent falls, frequent headaches Psych: Denies depression, anxiety Endocrine: Denies cold intolerance, heat intolerance, polydipsia Heme: Denies enlarged lymph nodes Allergy: No hayfever  Objective:   BP 110/60   Pulse 62   Temp 98 F (36.7 C) (Oral)   Ht _0  (1.778 m)   Wt 246 lb 4 oz (111.7 kg)   BMI 35.33 kg/m  Ideal Body Weight: Weight in (lb) to have BMI = 25: 173.9  No exam data present  GEN: well developed, well nourished, no acute distress Eyes: conjunctiva and lids normal, PERRLA, EOMI ENT: TM clear, nares clear, oral exam WNL Neck: supple, no lymphadenopathy, no thyromegaly, no JVD Pulm: clear to auscultation and percussion, respiratory effort normal CV: regular rate and rhythm, S1-S2, no murmur, rub or gallop, no bruits, peripheral pulses normal and symmetric, no cyanosis, clubbing, edema or varicosities GI: soft, non-tender; no hepatosplenomegaly, masses; active bowel sounds all quadrants GU: no hernia, testicular mass, penile discharge Lymph: no cervical, axillary or inguinal adenopathy MSK: gait normal, muscle tone and strength WNL, no joint swelling, effusions, discoloration, crepitus  SKIN: clear, good turgor, color WNL, no rashes, lesions, or ulcerations Neuro: normal mental status, normal strength, sensation, and motion Psych: alert; oriented to person, place and time, normally interactive and not anxious or depressed in appearance. All labs reviewed with patient.  Lipids:    Component Value Date/Time   CHOL 255 (H) 12/15/2016 0837   TRIG 306.0 (H) 12/15/2016 0837   HDL 43.80 12/15/2016 0837   LDLDIRECT 170.0 12/15/2016 0837   VLDL 61.2 (H) 12/15/2016 0837   CHOLHDL 6 12/15/2016 0837   CBC: CBC Latest Ref Rng & Units 12/15/2016 11/22/2015 10/06/2013  WBC 4.0 - 10.5 K/uL 6.3 6.5 6.9  Hemoglobin 13.0 - 17.0 g/dL 17.6(H) 18.9 Repeated and verified X2.[facility]) 18.7(HH)  Hematocrit 39.0 - 52.0  % 50.3 56.5 aH(H) 54.1(H)  Platelets 150.0 - 400.0 K/uL 190.0 185.0 580.9    Basic Metabolic Panel:    Component Value Date/Time   NA 140 12/15/2016 0837   NA 139 08/14/2012 1044   K 4.4 12/15/2016 0837   K 4.2 08/14/2012 1044   CL 100 12/15/2016 0837   CL 102 08/14/2012 1044   CO2 31 12/15/2016 0837   CO2 29 08/14/2012 1044   BUN 15 12/15/2016 0837   BUN 15 08/14/2012 1044   CREATININE 1.00 12/15/2016 0837   CREATININE 0.83 08/14/2012 1044   GLUCOSE 137 (H) 12/15/2016 0837   GLUCOSE 79 08/14/2012 1044   CALCIUM 9.7 12/15/2016 0837   CALCIUM 9.1 08/14/2012 1044   Hepatic Function Latest Ref Rng & Units 12/15/2016 11/22/2015 10/06/2013  Total Protein 6.0 - 8.3 g/dL 7.4 7.6 7.9  Albumin 3.5 - 5.2 g/dL 4.5 4.5 5.0  AST 0 - 37 U/L 35 32 31  ALT 0 - 53 U/L 51 42 32  Alk Phosphatase 39 - 117 U/L 55 54 72  Total Bilirubin 0.2 - 1.2 mg/dL 0.6 0.6 0.9  Bilirubin, Direct 0.0 - 0.3 mg/dL 0.1 0.1 0.2    Lab Results  Component Value Date  TSH 0.89 02/03/2011   Lab Results  Component Value Date   PSA 0.55 12/15/2016   PSA 0.67 11/22/2015   PSA 0.47 11/18/2014    Assessment and Plan:   Healthcare maintenance  Screen for colon cancer - Plan: Ambulatory referral to Gastroenterology  Doing well overall. See scanned labs.  Health Maintenance Exam: The patient's preventative maintenance and recommended screening tests for an annual wellness exam were reviewed in full today. Brought up to date unless services declined.  Counselled on the importance of diet, exercise, and its role in overall health and mortality. The patient's FH and SH was reviewed, including their home life, tobacco status, and drug and alcohol status.  Follow-up in 1 year for physical exam or additional follow-up below.  Follow-up: No Follow-up on file. Or follow-up in 1 year if not noted.  Meds ordered this encounter  Medications  . simvastatin (ZOCOR) 10 MG tablet    Sig: Take 1 tablet (10 mg total)  by mouth daily.    Dispense:  90 tablet    Refill:  3  . hydrochlorothiazide (HYDRODIURIL) 25 MG tablet    Sig: TAKE ONE-HALF TABLET BY MOUTH ONCE DAILY AS NEEDED    Dispense:  45 tablet    Refill:  3  . citalopram (CELEXA) 40 MG tablet    Sig: Take 1 tablet (40 mg total) by mouth daily.    Dispense:  90 tablet    Refill:  3  . buPROPion (WELLBUTRIN XL) 150 MG 24 hr tablet    Sig: Take 1 tablet (150 mg total) by mouth daily.    Dispense:  90 tablet    Refill:  3   Orders Placed This Encounter  Procedures  . Ambulatory referral to Gastroenterology    Signed,  Frederico Hamman T. Justis Closser, MD   Allergies as of 12/24/2017      Reactions   Sulfonamide Derivatives    Morphine And Related Rash      Medication List        Accurate as of 12/24/17  8:27 AM. Always use your most recent med list.          aspirin 81 MG tablet Take 81 mg by mouth daily.   buPROPion 150 MG 24 hr tablet Commonly known as:  WELLBUTRIN XL Take 1 tablet (150 mg total) by mouth daily.   citalopram 40 MG tablet Commonly known as:  CELEXA Take 1 tablet (40 mg total) by mouth daily.   hydrochlorothiazide 25 MG tablet Commonly known as:  HYDRODIURIL TAKE ONE-HALF TABLET BY MOUTH ONCE DAILY AS NEEDED   NEEDLE (DISP) 25 G 25G X 1-1/2" Misc Use as directed   simvastatin 10 MG tablet Commonly known as:  ZOCOR Take 1 tablet (10 mg total) by mouth daily.   Syringe (Disposable) 3 ML Misc Use as directed   zolpidem 10 MG tablet Commonly known as:  AMBIEN Take 1 tablet (10 mg total) by mouth at bedtime as needed. for sleep

## 2018-03-26 ENCOUNTER — Encounter: Payer: Self-pay | Admitting: Family Medicine

## 2018-03-26 ENCOUNTER — Telehealth: Payer: Self-pay | Admitting: Family Medicine

## 2018-03-26 NOTE — Telephone Encounter (Signed)
Copied from CRM [phone]. Topic: Inquiry >> Mar 26, 2018  8:08 AM Maia Pettiesrtiz, Kristie S wrote: Reason for CRM: Pt states that he had independent lab to recheck testosterone w/in the past 2 weeks. His level was 226. Pt states he hasn't been too up to par and would like to restart on testosterone. Please advise.

## 2018-03-26 NOTE — Telephone Encounter (Signed)
I LMOM. Complicated medical question. He does have hypogonadism, but he has had persistently elevated Hgb, and this is why his testosterone was discontinued.   Suggested f/u in the office was reasonable. If he does not do this, I can try to reach him when I can during evening hours.

## 2018-04-22 LAB — HM DIABETES EYE EXAM

## 2018-12-18 ENCOUNTER — Other Ambulatory Visit: Payer: Self-pay | Admitting: Family Medicine

## 2018-12-18 DIAGNOSIS — Z0000 Encounter for general adult medical examination without abnormal findings: Secondary | ICD-10-CM

## 2018-12-23 ENCOUNTER — Other Ambulatory Visit (INDEPENDENT_AMBULATORY_CARE_PROVIDER_SITE_OTHER): Payer: Managed Care, Other (non HMO)

## 2018-12-23 DIAGNOSIS — Z0000 Encounter for general adult medical examination without abnormal findings: Secondary | ICD-10-CM

## 2018-12-23 LAB — CBC WITH DIFFERENTIAL/PLATELET
Basophils Absolute: 0.1 10*3/uL (ref 0.0–0.1)
Basophils Relative: 1 % (ref 0.0–3.0)
Eosinophils Absolute: 0.3 10*3/uL (ref 0.0–0.7)
Eosinophils Relative: 4.2 % (ref 0.0–5.0)
HCT: 46.4 % (ref 39.0–52.0)
Hemoglobin: 14.7 g/dL (ref 13.0–17.0)
Lymphocytes Relative: 30.7 % (ref 12.0–46.0)
Lymphs Abs: 1.9 10*3/uL (ref 0.7–4.0)
MCHC: 31.7 g/dL (ref 30.0–36.0)
MCV: 73.4 fl — ABNORMAL LOW (ref 78.0–100.0)
Monocytes Absolute: 0.7 10*3/uL (ref 0.1–1.0)
Monocytes Relative: 10.6 % (ref 3.0–12.0)
Neutro Abs: 3.4 10*3/uL (ref 1.4–7.7)
Neutrophils Relative %: 53.5 % (ref 43.0–77.0)
Platelets: 265 10*3/uL (ref 150.0–400.0)
RBC: 6.33 Mil/uL — ABNORMAL HIGH (ref 4.22–5.81)
RDW: 17.8 % — ABNORMAL HIGH (ref 11.5–15.5)
WBC: 6.3 10*3/uL (ref 4.0–10.5)

## 2018-12-23 LAB — HEPATIC FUNCTION PANEL
ALT: 23 U/L (ref 0–53)
AST: 33 U/L (ref 0–37)
Albumin: 4.3 g/dL (ref 3.5–5.2)
Alkaline Phosphatase: 57 U/L (ref 39–117)
Bilirubin, Direct: 0.2 mg/dL (ref 0.0–0.3)
Total Bilirubin: 0.6 mg/dL (ref 0.2–1.2)
Total Protein: 7.1 g/dL (ref 6.0–8.3)

## 2018-12-23 LAB — LIPID PANEL
Cholesterol: 150 mg/dL (ref 0–200)
HDL: 38.9 mg/dL — ABNORMAL LOW (ref >39.00)
LDL Cholesterol: 87 mg/dL (ref 0–99)
NonHDL: 110.7
Total CHOL/HDL Ratio: 4
Triglycerides: 120 mg/dL (ref 0.0–149.0)
VLDL: 24 mg/dL (ref 0.0–40.0)

## 2018-12-23 LAB — HEMOGLOBIN A1C: Hgb A1c MFr Bld: 6.2 % (ref 4.6–6.5)

## 2018-12-23 LAB — BASIC METABOLIC PANEL
BUN: 14 mg/dL (ref 6–23)
CO2: 31 mEq/L (ref 19–32)
Calcium: 9.1 mg/dL (ref 8.4–10.5)
Chloride: 101 mEq/L (ref 96–112)
Creatinine, Ser: 1.19 mg/dL (ref 0.40–1.50)
GFR: 61.6 mL/min (ref >60.00)
Glucose, Bld: 120 mg/dL — ABNORMAL HIGH (ref 70–99)
Potassium: 4.2 mEq/L (ref 3.5–5.1)
Sodium: 140 mEq/L (ref 135–145)

## 2018-12-24 LAB — PSA, TOTAL AND FREE
PSA, % Free: 44 % (calc) (ref >25)
PSA, Free: 0.4 ng/mL
PSA, Total: 0.9 ng/mL (ref <=4.0)

## 2018-12-28 NOTE — Progress Notes (Signed)
Dr. Frederico Hamman T. Mariah Gerstenberger, MD, [facility] Primary [facility] and Sports Medicine [place], [address] Phone: [phone] Fax: [phone]  12/30/2018  Patient: Tyler Ramsey, MRN: [id number], DOB: 17-Dec-1955, 63 y.o.  Primary Physician:  Owens Loffler, MD   Chief Complaint  Patient presents with  . Annual Exam   Subjective:   Tyler Ramsey is a 63 y.o. pleasant patient who presents with the following:  Preventative Health Maintenance Visit:  Health Maintenance Summary Reviewed and updated, unless pt declines services.  Tobacco History Reviewed. Alcohol: No concerns, no excessive use Exercise Habits: Some activity, rec at least 30 mins 5 times a week STD concerns: no risk or activity to increase risk Drug Use: None Encouraged self-testicular check  Injectable testosterone. Had hgb.  Going to do cologuard.  Wt Readings from Last 3 Encounters:  12/30/18 237 lb 12 oz (107.8 kg)  12/24/17 246 lb 4 oz (111.7 kg)  10/15/17 249 lb 12 oz (113.3 kg)     Health Maintenance  Topic Date Due  . COLONOSCOPY  10/23/2016  . INFLUENZA VACCINE  01/21/2019 (Originally 05/23/2018)  . URINE MICROALBUMIN  01/30/2019 (Originally 11/19/2015)  . TETANUS/TDAP  10/18/2023  . Hepatitis C Screening  Completed  . HIV Screening  Completed   Immunization History  Administered Date(s) Administered  . Influenza Split 08/12/2012  . Influenza-Unspecified 10/23/2016  . Pneumococcal Polysaccharide-23 12/01/2015  . Tdap 10/17/2013  . Zoster 12/01/2015   Patient Active Problem List   Diagnosis Date Noted  . HYPERTENSION 09/24/2008    Priority: High  . SLEEP APNEA 09/24/2008    Priority: Medium  . Major depressive disorder, recurrent episode, moderate ([place]) 12/03/2015  . Hypogonadism in male 11/29/2014  . FATIGUE 06/23/2010  . LIVER FUNCTION TESTS, ABNORMAL, HX OF 09/03/2009  . ALLERGIC RHINITIS 09/24/2008   Past Medical History:  Diagnosis Date  . Allergy    allergic rhinitis  . Depression   . Diabetes mellitus   . Hyperlipidemia   . Hypertension   . Hypogonadism in male 11/29/2014  . Sleep apnea    Past Surgical History:  Procedure Laterality Date  . KNEE ARTHROSCOPY W/ ACL RECONSTRUCTION AND HAMSTRING GRAFT  1985  . TONSILLECTOMY  1969  . TOTAL KNEE ARTHROPLASTY  5/11   Left, Dr Coralee North   Social History   Socioeconomic History  . Marital status: Married    Spouse name: Not on file  . Number of children: Not on file  . Years of education: Not on file  . Highest education level: Not on file  Occupational History  . 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 Financial resource strain: Not on file  . Food insecurity:    Worry: Not on file    Inability: Not on file  . Transportation needs:    Medical: Not on file    Non-medical: Not on file  Tobacco Use  . Smoking status: Never Smoker  . Smokeless tobacco: Never Used  Substance and Sexual Activity  . Alcohol use: No  . Drug use: No  . Sexual activity: Not on file  Lifestyle  . Physical activity:    Days per week: Not on file    Minutes per session: Not on file  . Stress: Not on file  Relationships  . Social connections:    Talks on phone: Not on file    Gets together: Not on file    Attends religious service: Not on file  Active member of club or organization: Not on file    Attends meetings of clubs or organizations: Not on file    Relationship status: Not on file  . Intimate partner violence:    Fear of current or ex partner: Not on file    Emotionally abused: Not on file    Physically abused: Not on file    Forced sexual activity: Not on file  Other Topics Concern  . Not on file  Social History Narrative   Lived in [place], moved to [place] and plans to settle in [place].   Family History  Problem Relation Age of Onset  . Emphysema Father   . Heart disease Mother    Allergies  Allergen Reactions  . Sulfonamide  Derivatives   . Morphine And Related Rash    Medication list has been reviewed and updated.   General: Denies fever, chills, sweats. No significant weight loss. Eyes: Denies blurring,significant itching ENT: Denies earache, sore throat, and hoarseness. Cardiovascular: Denies chest pains, palpitations, dyspnea on exertion Respiratory: Denies cough, dyspnea at rest,wheeezing Breast: no concerns about lumps GI: Denies nausea, vomiting, diarrhea, constipation, change in bowel habits, abdominal pain, melena, hematochezia GU: Denies penile discharge, ED, urinary flow / outflow problems. No STD concerns. Musculoskeletal: Denies back pain, joint pain Derm: Denies rash, itching Neuro: Denies  paresthesias, frequent falls, frequent headaches Psych: Denies depression, anxiety Endocrine: Denies cold intolerance, heat intolerance, polydipsia Heme: Denies enlarged lymph nodes Allergy: No hayfever  Objective:   BP 100/62   Pulse (!) 56   Temp (!) 97.5 F (36.4 C) (Oral)   Ht 5' 9.75" (1.772 m)   Wt 237 lb 12 oz (107.8 kg)   BMI 34.36 kg/m  Ideal Body Weight: Weight in (lb) to have BMI = 25: 172.6  No exam data present  GEN: well developed, well nourished, no acute distress Eyes: conjunctiva and lids normal, PERRLA, EOMI ENT: TM clear, nares clear, oral exam WNL Neck: supple, no lymphadenopathy, no thyromegaly, no JVD Pulm: clear to auscultation and percussion, respiratory effort normal CV: regular rate and rhythm, S1-S2, no murmur, rub or gallop, no bruits, peripheral pulses normal and symmetric, no cyanosis, clubbing, edema or varicosities GI: soft, non-tender; no hepatosplenomegaly, masses; active bowel sounds all quadrants GU: no hernia, testicular mass, penile discharge Lymph: no cervical, axillary or inguinal adenopathy MSK: gait normal, muscle tone and strength WNL, no joint swelling, effusions, discoloration, crepitus  SKIN: clear, good turgor, color WNL, no rashes, lesions, or  ulcerations Neuro: normal mental status, normal strength, sensation, and motion Psych: alert; oriented to person, place and time, normally interactive and not anxious or depressed in appearance.  All labs reviewed with patient.  Lipids: Lab Results  Component Value Date   CHOL 150 12/23/2018   Lab Results  Component Value Date   HDL 38.90 (L) 12/23/2018   Lab Results  Component Value Date   LDLCALC 87 12/23/2018   Lab Results  Component Value Date   TRIG 120.0 12/23/2018   Lab Results  Component Value Date   CHOLHDL 4 12/23/2018   CBC: CBC Latest Ref Rng & Units 12/23/2018 12/15/2016 11/22/2015  WBC 4.0 - 10.5 K/uL 6.3 6.3 6.5  Hemoglobin 13.0 - 17.0 g/dL 14.7 17.6(H) 18.9 Repeated and verified X2.(HH)  Hematocrit 39.0 - 52.0 % 46.4 50.3 56.5 aH(H)  Platelets 150.0 - 400.0 K/uL 265.0 190.0 259.5    Basic Metabolic Panel:    Component Value Date/Time   NA 140 12/23/2018 0842   NA  139 08/14/2012 1044   K 4.2 12/23/2018 0842   K 4.2 08/14/2012 1044   CL 101 12/23/2018 0842   CL 102 08/14/2012 1044   CO2 31 12/23/2018 0842   CO2 29 08/14/2012 1044   BUN 14 12/23/2018 0842   BUN 15 08/14/2012 1044   CREATININE 1.19 12/23/2018 0842   CREATININE 0.83 08/14/2012 1044   GLUCOSE 120 (H) 12/23/2018 0842   GLUCOSE 79 08/14/2012 1044   CALCIUM 9.1 12/23/2018 0842   CALCIUM 9.1 08/14/2012 1044   Hepatic Function Latest Ref Rng & Units 12/23/2018 12/15/2016 11/22/2015  Total Protein 6.0 - 8.3 g/dL 7.1 7.4 7.6  Albumin 3.5 - 5.2 g/dL 4.3 4.5 4.5  AST 0 - 37 U/L 33 35 32  ALT 0 - 53 U/L 23 51 42  Alk Phosphatase 39 - 117 U/L 57 55 54  Total Bilirubin 0.2 - 1.2 mg/dL 0.6 0.6 0.6  Bilirubin, Direct 0.0 - 0.3 mg/dL 0.2 0.1 0.1    Lab Results  Component Value Date   HGBA1C 6.2 12/23/2018   Lab Results  Component Value Date   TSH 0.89 02/03/2011   Lab Results  Component Value Date   PSA 0.55 12/15/2016   PSA 0.67 11/22/2015   PSA 0.47 11/18/2014    Assessment and  Plan:   Routine general medical examination at a health care facility  Health Maintenance Exam: The patient's preventative maintenance and recommended screening tests for an annual wellness exam were reviewed in full today. Brought up to date unless services declined.  Counselled on the importance of diet, exercise, and its role in overall health and mortality. The patient's FH and SH was reviewed, including their home life, tobacco status, and drug and alcohol status.  Follow-up in 1 year for physical exam or additional follow-up below.  Will check on cologuard  Follow-up: No follow-ups on file. Or follow-up in 1 year if not noted.  Signed,  Maud Deed. Telsa Dillavou, MD   Allergies as of 12/30/2018      Reactions   Sulfonamide Derivatives    Morphine And Related Rash      Medication List       Accurate as of December 30, 2018  9:09 AM. Always use your most recent med list.        aspirin 81 MG tablet Take 81 mg by mouth daily.   citalopram 40 MG tablet Commonly known as:  CELEXA Take 1 tablet (40 mg total) by mouth daily.   hydrochlorothiazide 12.5 MG tablet Commonly known as:  HYDRODIURIL Take 1 tablet (12.5 mg total) by mouth daily.   NEEDLE (DISP) 25 G 25G X 1-1/2" Misc Use as directed   simvastatin 10 MG tablet Commonly known as:  ZOCOR Take 1 tablet (10 mg total) by mouth daily.   Syringe (Disposable) 3 ML Misc Use as directed   zolpidem 10 MG tablet Commonly known as:  AMBIEN Take 1 tablet (10 mg total) by mouth at bedtime as needed. for sleep

## 2018-12-30 ENCOUNTER — Encounter: Payer: Self-pay | Admitting: Family Medicine

## 2018-12-30 ENCOUNTER — Ambulatory Visit (INDEPENDENT_AMBULATORY_CARE_PROVIDER_SITE_OTHER): Payer: Managed Care, Other (non HMO) | Admitting: Family Medicine

## 2018-12-30 VITALS — BP 100/62 | HR 56 | Temp 97.5°F | Ht 69.75 in | Wt 237.8 lb

## 2018-12-30 DIAGNOSIS — Z0000 Encounter for general adult medical examination without abnormal findings: Secondary | ICD-10-CM

## 2018-12-30 MED ORDER — SIMVASTATIN 10 MG PO TABS: 10.0000 mg | ORAL_TABLET | Freq: Every day | ORAL | 3 refills | Status: DC

## 2018-12-30 MED ORDER — HYDROCHLOROTHIAZIDE 12.5 MG PO TABS: 12.5000 mg | ORAL_TABLET | Freq: Every day | ORAL | 3 refills | Status: DC

## 2018-12-30 MED ORDER — ZOLPIDEM TARTRATE 10 MG PO TABS: 10.0000 mg | ORAL_TABLET | Freq: Every evening | ORAL | 5 refills | Status: DC | PRN

## 2018-12-30 MED ORDER — CITALOPRAM HYDROBROMIDE 40 MG PO TABS: 40.0000 mg | ORAL_TABLET | Freq: Every day | ORAL | 3 refills | Status: DC

## 2019-05-09 ENCOUNTER — Ambulatory Visit (INDEPENDENT_AMBULATORY_CARE_PROVIDER_SITE_OTHER): Payer: Managed Care, Other (non HMO) | Admitting: Podiatry

## 2019-05-09 ENCOUNTER — Other Ambulatory Visit: Payer: Self-pay

## 2019-05-09 ENCOUNTER — Encounter: Payer: Self-pay | Admitting: Podiatry

## 2019-05-09 ENCOUNTER — Ambulatory Visit (INDEPENDENT_AMBULATORY_CARE_PROVIDER_SITE_OTHER): Payer: Managed Care, Other (non HMO)

## 2019-05-09 VITALS — Temp 96.8°F

## 2019-05-09 DIAGNOSIS — M778 Other enthesopathies, not elsewhere classified: Secondary | ICD-10-CM

## 2019-05-09 DIAGNOSIS — M779 Enthesopathy, unspecified: Secondary | ICD-10-CM

## 2019-05-09 DIAGNOSIS — M7752 Other enthesopathy of left foot: Secondary | ICD-10-CM | POA: Diagnosis not present

## 2019-05-12 NOTE — Progress Notes (Signed)
   HPI: 63 y.o. male presenting today with a chief complaint of intermittent pain to the left fifth toe that began 35 years ago in 1985 secondary to an injury. He reports associated swelling. He states he initially fractured the toe. He has been soaking the toe in Epsom salt for treatment. There are currently no modifying factors. Patient is here for further evaluation and treatment.   Past Medical History:  Diagnosis Date  . Allergy    allergic rhinitis  . Depression   . Diabetes mellitus   . Hyperlipidemia   . Hypertension   . Hypogonadism in male 11/29/2014  . Sleep apnea      Physical Exam: General: The patient is alert and oriented x3 in no acute distress.  Dermatology: Skin is warm, dry and supple bilateral lower extremities. Negative for open lesions or macerations.  Vascular: Palpable pedal pulses bilaterally. Capillary refill within normal limits.  Neurological: Epicritic and protective threshold grossly intact bilaterally.   Musculoskeletal Exam: Bulbous left fifth toe noted with pain with palpation. Range of motion within normal limits to all pedal and ankle joints bilateral. Muscle strength 5/5 in all groups bilateral.   Radiographic Exam:  Soft tissue enlargement noted to the left fifth toe. Normal osseous mineralization. Joint spaces preserved. No fracture/dislocation/boney destruction.    Assessment: 1. 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 Patient evaluated. X-Rays reviewed.  2. Injection of 0.5 mLs Celestone Soluspan injected into the left 5th toe.  3. Recommended good shoe gear.  4. Return to clinic as needed if patient wants to proceed with debulking procedure.       Edrick Kins, DPM [facility]  Dr. Edrick Kins, DPM    [address], [place] [address]                Office [phone]  Fax [phone]

## 2019-12-26 ENCOUNTER — Other Ambulatory Visit: Payer: Self-pay | Admitting: Family Medicine

## 2019-12-30 ENCOUNTER — Other Ambulatory Visit: Payer: Self-pay | Admitting: Family Medicine

## 2019-12-30 DIAGNOSIS — Z0000 Encounter for general adult medical examination without abnormal findings: Secondary | ICD-10-CM

## 2019-12-30 DIAGNOSIS — R739 Hyperglycemia, unspecified: Secondary | ICD-10-CM

## 2019-12-31 ENCOUNTER — Other Ambulatory Visit (INDEPENDENT_AMBULATORY_CARE_PROVIDER_SITE_OTHER): Payer: Managed Care, Other (non HMO)

## 2019-12-31 ENCOUNTER — Other Ambulatory Visit: Payer: Self-pay

## 2019-12-31 DIAGNOSIS — R739 Hyperglycemia, unspecified: Secondary | ICD-10-CM | POA: Diagnosis not present

## 2019-12-31 DIAGNOSIS — Z0000 Encounter for general adult medical examination without abnormal findings: Secondary | ICD-10-CM

## 2019-12-31 LAB — HEPATIC FUNCTION PANEL
ALT: 32 U/L (ref 0–53)
AST: 30 U/L (ref 0–37)
Albumin: 4.4 g/dL (ref 3.5–5.2)
Alkaline Phosphatase: 77 U/L (ref 39–117)
Bilirubin, Direct: 0.2 mg/dL (ref 0.0–0.3)
Total Bilirubin: 0.9 mg/dL (ref 0.2–1.2)
Total Protein: 7.8 g/dL (ref 6.0–8.3)

## 2019-12-31 LAB — CBC WITH DIFFERENTIAL/PLATELET
Basophils Absolute: 0 10*3/uL (ref 0.0–0.1)
Basophils Relative: 0.7 % (ref 0.0–3.0)
Eosinophils Absolute: 0.2 10*3/uL (ref 0.0–0.7)
Eosinophils Relative: 3.8 % (ref 0.0–5.0)
HCT: 50 % (ref 39.0–52.0)
Hemoglobin: 16.7 g/dL (ref 13.0–17.0)
Lymphocytes Relative: 34.8 % (ref 12.0–46.0)
Lymphs Abs: 1.9 10*3/uL (ref 0.7–4.0)
MCHC: 33.5 g/dL (ref 30.0–36.0)
MCV: 86.2 fl (ref 78.0–100.0)
Monocytes Absolute: 0.6 10*3/uL (ref 0.1–1.0)
Monocytes Relative: 10.8 % (ref 3.0–12.0)
Neutro Abs: 2.7 10*3/uL (ref 1.4–7.7)
Neutrophils Relative %: 49.9 % (ref 43.0–77.0)
Platelets: 202 10*3/uL (ref 150.0–400.0)
RBC: 5.8 Mil/uL (ref 4.22–5.81)
RDW: 19.4 % — ABNORMAL HIGH (ref 11.5–15.5)
WBC: 5.5 10*3/uL (ref 4.0–10.5)

## 2019-12-31 LAB — BASIC METABOLIC PANEL
BUN: 13 mg/dL (ref 6–23)
CO2: 35 mEq/L — ABNORMAL HIGH (ref 19–32)
Calcium: 9.8 mg/dL (ref 8.4–10.5)
Chloride: 97 mEq/L (ref 96–112)
Creatinine, Ser: 1.1 mg/dL (ref 0.40–1.50)
GFR: 67.24 mL/min (ref >60.00)
Glucose, Bld: 110 mg/dL — ABNORMAL HIGH (ref 70–99)
Potassium: 3.9 mEq/L (ref 3.5–5.1)
Sodium: 138 mEq/L (ref 135–145)

## 2019-12-31 LAB — HEMOGLOBIN A1C: Hgb A1c MFr Bld: 5.7 % (ref 4.6–6.5)

## 2019-12-31 LAB — LIPID PANEL
Cholesterol: 176 mg/dL (ref 0–200)
HDL: 55.2 mg/dL (ref >39.00)
LDL Cholesterol: 88 mg/dL (ref 0–99)
NonHDL: 121.03
Total CHOL/HDL Ratio: 3
Triglycerides: 163 mg/dL — ABNORMAL HIGH (ref 0.0–149.0)
VLDL: 32.6 mg/dL (ref 0.0–40.0)

## 2020-01-01 LAB — PSA, TOTAL WITH REFLEX TO PSA, FREE: PSA, Total: 0.7 ng/mL (ref <=4.0)

## 2020-01-07 ENCOUNTER — Ambulatory Visit (INDEPENDENT_AMBULATORY_CARE_PROVIDER_SITE_OTHER): Payer: Managed Care, Other (non HMO) | Admitting: Family Medicine

## 2020-01-07 ENCOUNTER — Encounter: Payer: Self-pay | Admitting: Family Medicine

## 2020-01-07 ENCOUNTER — Other Ambulatory Visit: Payer: Self-pay

## 2020-01-07 VITALS — BP 134/78 | HR 52 | Temp 97.7°F | Ht 69.5 in | Wt 222.5 lb

## 2020-01-07 DIAGNOSIS — Z0000 Encounter for general adult medical examination without abnormal findings: Secondary | ICD-10-CM | POA: Diagnosis not present

## 2020-01-07 MED ORDER — SIMVASTATIN 10 MG PO TABS: 10.0000 mg | ORAL_TABLET | Freq: Every day | ORAL | 3 refills | Status: DC

## 2020-01-07 MED ORDER — HYDROCHLOROTHIAZIDE 12.5 MG PO TABS: 12.5000 mg | ORAL_TABLET | Freq: Every day | ORAL | 3 refills | Status: DC

## 2020-01-07 MED ORDER — CITALOPRAM HYDROBROMIDE 40 MG PO TABS: 40.0000 mg | ORAL_TABLET | Freq: Every day | ORAL | 3 refills | Status: DC

## 2020-01-07 NOTE — Progress Notes (Signed)
Tyler Guastella T. Meleane Selinger, MD Primary Care and Sports Medicine [facility] at [facility] [address] [place], [address] Phone: [phone]  FAX: [phone]  SAAHIR PRUDE - 64 y.o. male  MRN [id number]  Date of Birth: April 09, 1956  Visit Date: 01/07/2020  PCP: Hannah Beat, MD  Referred by: Hannah Beat, MD  Chief Complaint  Patient presents with  . Annual Exam    This visit occurred during the SARS-CoV-2 public health emergency.  Safety protocols were in place, including screening questions prior to the visit, additional usage of staff PPE, and extensive cleaning of exam room while observing appropriate contact time as indicated for disinfecting solutions.   Patient Care Team: Hannah Beat, MD as PCP - General Subjective:   Tyler Ramsey is a 64 y.o. pleasant patient who presents with the following:  Preventative Health Maintenance Visit:  Health Maintenance Summary Reviewed and updated, unless pt declines services.  Tobacco History Reviewed. Alcohol: No concerns, no excessive use - 3 on weekend Exercise Habits: Some activity, rec at least 30 mins 5 times a week STD concerns: no risk or activity to increase risk Drug Use: None  Colon?  He is going to check about Cologuard. Covid-19 vaccine-he needs to set this up  Eye floater Cologuard.   Wt Readings from Last 3 Encounters:  01/07/20 222 lb 8 oz (100.9 kg)  12/30/18 237 lb 12 oz (107.8 kg)  12/24/17 246 lb 4 oz (111.7 kg)    Health Maintenance  Topic Date Due  . COLONOSCOPY  10/23/2016  . INFLUENZA VACCINE  01/21/2020 (Originally 05/24/2019)  . TETANUS/TDAP  10/18/2023  . Hepatitis C Screening  Completed  . HIV Screening  Completed   Immunization History  Administered Date(s) Administered  . Influenza Split 08/12/2012  . Influenza-Unspecified 10/23/2016  . Pneumococcal Polysaccharide-23 12/01/2015  . Tdap 10/17/2013  . Zoster 12/01/2015   Patient Active Problem  List   Diagnosis Date Noted  . HYPERTENSION 09/24/2008    Priority: High  . SLEEP APNEA 09/24/2008    Priority: Medium  . Major depressive disorder, recurrent episode, moderate (HCC) 12/03/2015  . Hypogonadism in male 11/29/2014  . FATIGUE 06/23/2010  . LIVER FUNCTION TESTS, ABNORMAL, HX OF 09/03/2009  . ALLERGIC RHINITIS 09/24/2008    Past Medical History:  Diagnosis Date  . Allergy    allergic rhinitis  . Depression   . Diabetes mellitus   . Hyperlipidemia   . Hypertension   . Hypogonadism in male 11/29/2014  . Sleep apnea     Past Surgical History:  Procedure Laterality Date  . KNEE ARTHROSCOPY W/ ACL RECONSTRUCTION AND HAMSTRING GRAFT  1985  . TONSILLECTOMY  1969  . TOTAL KNEE ARTHROPLASTY  5/11   Left, Dr Carma Lair    Family History  Problem Relation Age of Onset  . Emphysema Father   . Heart disease Mother     Past Medical History, Surgical History, Social History, Family History, Problem List, Medications, and Allergies have been reviewed and updated if relevant.  Review of Systems: Pertinent positives are listed above.  Otherwise, a full 14 point review of systems has been done in full and it is negative except where it is noted positive.  Objective:   BP 134/78   Pulse (!) 52   Temp 97.7 F (36.5 C) (Temporal)   Ht 5' 9.5" (1.765 m)   Wt 222 lb 8 oz (100.9 kg)   SpO2 99%   BMI 32.39 kg/m  Ideal  Body Weight: Weight in (lb) to have BMI = 25: 171.4  Ideal Body Weight: Weight in (lb) to have BMI = 25: 171.4 No exam data present Depression screen [facility] 2/9 12/30/2018 12/24/2017  Decreased Interest 0 0  Down, Depressed, Hopeless 0 0  PHQ - 2 Score 0 0     GEN: well developed, well nourished, no acute distress Eyes: conjunctiva and lids normal, PERRLA, EOMI ENT: TM clear, nares clear, oral exam WNL Neck: supple, no lymphadenopathy, no thyromegaly, no JVD Pulm: clear to auscultation and percussion, respiratory effort normal CV: regular rate and  rhythm, S1-S2, no murmur, rub or gallop, no bruits, peripheral pulses normal and symmetric, no cyanosis, clubbing, edema or varicosities GI: soft, non-tender; no hepatosplenomegaly, masses; active bowel sounds all quadrants GU: no hernia, testicular mass, penile discharge Lymph: no cervical, axillary or inguinal adenopathy MSK: gait normal, muscle tone and strength WNL, no joint swelling, effusions, discoloration, crepitus  SKIN: clear, good turgor, color WNL, no rashes, lesions, or ulcerations Neuro: normal mental status, normal strength, sensation, and motion Psych: alert; oriented to person, place and time, normally interactive and not anxious or depressed in appearance.  All labs reviewed with patient. Results for orders placed or performed in visit on 12/31/19  PSA, Total with Reflex to PSA, Free  Result Value Ref Range   PSA, Total 0.7 < OR = 4.0 ng/mL  Hemoglobin A1c  Result Value Ref Range   Hgb A1c MFr Bld 5.7 4.6 - 6.5 %  CBC with Differential/Platelet  Result Value Ref Range   WBC 5.5 4.0 - 10.5 K/uL   RBC 5.80 4.22 - 5.81 Mil/uL   Hemoglobin 16.7 13.0 - 17.0 g/dL   HCT 12.4 58.0 - 99.8 %   MCV 86.2 78.0 - 100.0 fl   MCHC 33.5 30.0 - 36.0 g/dL   RDW 33.8 (H) 25.0 - 53.9 %   Platelets 202.0 150.0 - 400.0 K/uL   Neutrophils Relative % 49.9 43.0 - 77.0 %   Lymphocytes Relative 34.8 12.0 - 46.0 %   Monocytes Relative 10.8 3.0 - 12.0 %   Eosinophils Relative 3.8 0.0 - 5.0 %   Basophils Relative 0.7 0.0 - 3.0 %   Neutro Abs 2.7 1.4 - 7.7 K/uL   Lymphs Abs 1.9 0.7 - 4.0 K/uL   Monocytes Absolute 0.6 0.1 - 1.0 K/uL   Eosinophils Absolute 0.2 0.0 - 0.7 K/uL   Basophils Absolute 0.0 0.0 - 0.1 K/uL  Basic metabolic panel  Result Value Ref Range   Sodium 138 135 - 145 mEq/L   Potassium 3.9 3.5 - 5.1 mEq/L   Chloride 97 96 - 112 mEq/L   CO2 35 (H) 19 - 32 mEq/L   Glucose, Bld 110 (H) 70 - 99 mg/dL   BUN 13 6 - 23 mg/dL   Creatinine, Ser 7.67 0.40 - 1.50 mg/dL   GFR 34.19  >37.90 mL/min   Calcium 9.8 8.4 - 10.5 mg/dL  Hepatic function panel  Result Value Ref Range   Total Bilirubin 0.9 0.2 - 1.2 mg/dL   Bilirubin, Direct 0.2 0.0 - 0.3 mg/dL   Alkaline Phosphatase 77 39 - 117 U/L   AST 30 0 - 37 U/L   ALT 32 0 - 53 U/L   Total Protein 7.8 6.0 - 8.3 g/dL   Albumin 4.4 3.5 - 5.2 g/dL  Lipid panel  Result Value Ref Range   Cholesterol 176 0 - 200 mg/dL   Triglycerides 240.9 (H) 0.0 - 149.0 mg/dL  HDL 55.20 >39.00 mg/dL   VLDL 32.6 0.0 - 40.0 mg/dL   LDL Cholesterol 88 0 - 99 mg/dL   Total CHOL/HDL Ratio 3    NonHDL 121.03     Assessment and Plan:     ICD-10-CM   1. Healthcare maintenance  Z00.00    Globally he is doing well.  He will schedule Cologuard as well as his COVID-19 vaccines.  He has lost 25 pounds which I congratulated him on.  Otherwise he has no significant complaints  Health Maintenance Exam: The patient's preventative maintenance and recommended screening tests for an annual wellness exam were reviewed in full today. Brought up to date unless services declined.  Counselled on the importance of diet, exercise, and its role in overall health and mortality. The patient's FH and SH was reviewed, including their home life, tobacco status, and drug and alcohol status.  Follow-up in 1 year for physical exam or additional follow-up below.  Follow-up: No follow-ups on file. Or follow-up in 1 year if not noted.  No orders of the defined types were placed in this encounter.  There are no discontinued medications. No orders of the defined types were placed in this encounter.   Signed,  Maud Deed. Venesha Petraitis, MD   Allergies as of 01/07/2020      Reactions   Sulfonamide Derivatives    Morphine And Related Rash      Medication List       Accurate as of January 07, 2020 10:49 AM. If you have any questions, ask your nurse or doctor.        aspirin 81 MG tablet Take 81 mg by mouth daily.   citalopram 40 MG tablet Commonly known  as: CELEXA Take 1 tablet by mouth once daily   hydrochlorothiazide 12.5 MG tablet Commonly known as: HYDRODIURIL Take 1 tablet by mouth once daily   NEEDLE (DISP) 25 G 25G X 1-1/2" Misc Use as directed   simvastatin 10 MG tablet Commonly known as: ZOCOR Take 1 tablet by mouth once daily   Syringe (Disposable) 3 ML Misc Use as directed   zolpidem 10 MG tablet Commonly known as: AMBIEN Take 1 tablet (10 mg total) by mouth at bedtime as needed. for sleep

## 2020-01-07 NOTE — Patient Instructions (Addendum)
Check on Cologuard with your insurance.   [facility] Station: Charlene Scott Duncan Dull

## 2020-02-05 ENCOUNTER — Ambulatory Visit: Payer: Managed Care, Other (non HMO) | Attending: Internal Medicine

## 2020-02-05 DIAGNOSIS — Z23 Encounter for immunization: Secondary | ICD-10-CM

## 2020-02-05 NOTE — Progress Notes (Signed)
   Covid-19 Vaccination Clinic  Name:  Tyler Ramsey    MRN: [id number] DOB: 06/11/1956  02/05/2020  [name] was observed post Covid-19 immunization for 15 minutes without incident. He was provided with Vaccine Information Sheet and instruction to access the V-Safe system.   [name] was instructed to call 911 with any severe reactions post vaccine: Marland Kitchen Difficulty breathing  . Swelling of face and throat  . A fast heartbeat  . A bad rash all over body  . Dizziness and weakness   Immunizations Administered    Name Date Dose VIS Date Route   Pfizer COVID-19 Vaccine 02/05/2020  8:48 AM 0.3 mL 10/03/2019 Intramuscular   Manufacturer: [employer], [employer]   Lot: [id number]   NDC: [address]-1

## 2020-03-02 ENCOUNTER — Ambulatory Visit: Payer: Managed Care, Other (non HMO)

## 2020-03-09 ENCOUNTER — Ambulatory Visit: Payer: Managed Care, Other (non HMO) | Attending: Internal Medicine

## 2020-03-09 DIAGNOSIS — Z23 Encounter for immunization: Secondary | ICD-10-CM

## 2020-03-09 NOTE — Progress Notes (Signed)
   Covid-19 Vaccination Clinic  Name:  NIKOLAOS MADDOCKS    MRN: [id number] DOB: 1956/06/23  03/09/2020  Mr. Verrette was observed post Covid-19 immunization for 15 minutes without incident. He was provided with Vaccine Information Sheet and instruction to access the V-Safe system.   Mr. Ke was instructed to call 911 with any severe reactions post vaccine: Marland Kitchen Difficulty breathing  . Swelling of face and throat  . A fast heartbeat  . A bad rash all over body  . Dizziness and weakness   Immunizations Administered    Name Date Dose VIS Date Route   Pfizer COVID-19 Vaccine 03/09/2020 10:35 AM 0.3 mL 12/17/2018 Intramuscular   Manufacturer: [employer], [employer]   Lot: [id number]   NDC: [address]-2

## 2020-08-04 ENCOUNTER — Telehealth: Payer: Managed Care, Other (non HMO) | Admitting: Family Medicine

## 2020-08-04 ENCOUNTER — Other Ambulatory Visit: Payer: Self-pay

## 2020-08-04 ENCOUNTER — Encounter: Payer: Self-pay | Admitting: Family Medicine

## 2020-08-04 ENCOUNTER — Other Ambulatory Visit (INDEPENDENT_AMBULATORY_CARE_PROVIDER_SITE_OTHER): Payer: Managed Care, Other (non HMO)

## 2020-08-04 ENCOUNTER — Telehealth (INDEPENDENT_AMBULATORY_CARE_PROVIDER_SITE_OTHER): Payer: Managed Care, Other (non HMO) | Admitting: Family Medicine

## 2020-08-04 DIAGNOSIS — R5382 Chronic fatigue, unspecified: Secondary | ICD-10-CM | POA: Diagnosis not present

## 2020-08-04 DIAGNOSIS — M2550 Pain in unspecified joint: Secondary | ICD-10-CM | POA: Diagnosis not present

## 2020-08-04 DIAGNOSIS — Z9884 Bariatric surgery status: Secondary | ICD-10-CM | POA: Diagnosis not present

## 2020-08-04 DIAGNOSIS — M791 Myalgia, unspecified site: Secondary | ICD-10-CM | POA: Diagnosis not present

## 2020-08-04 DIAGNOSIS — R5383 Other fatigue: Secondary | ICD-10-CM | POA: Insufficient documentation

## 2020-08-04 LAB — COMPREHENSIVE METABOLIC PANEL
ALT: 36 U/L (ref 0–53)
AST: 31 U/L (ref 0–37)
Albumin: 4.3 g/dL (ref 3.5–5.2)
Alkaline Phosphatase: 103 U/L (ref 39–117)
BUN: 17 mg/dL (ref 6–23)
CO2: 29 mEq/L (ref 19–32)
Calcium: 9.5 mg/dL (ref 8.4–10.5)
Chloride: 96 mEq/L (ref 96–112)
Creatinine, Ser: 0.99 mg/dL (ref 0.40–1.50)
GFR: 79.42 mL/min (ref >60.00)
Glucose, Bld: 133 mg/dL — ABNORMAL HIGH (ref 70–99)
Potassium: 4 mEq/L (ref 3.5–5.1)
Sodium: 137 mEq/L (ref 135–145)
Total Bilirubin: 0.6 mg/dL (ref 0.2–1.2)
Total Protein: 7.5 g/dL (ref 6.0–8.3)

## 2020-08-04 LAB — CBC WITH DIFFERENTIAL/PLATELET
Basophils Absolute: 0.1 10*3/uL (ref 0.0–0.1)
Basophils Relative: 0.6 % (ref 0.0–3.0)
Eosinophils Absolute: 0.2 10*3/uL (ref 0.0–0.7)
Eosinophils Relative: 2 % (ref 0.0–5.0)
HCT: 48.3 % (ref 39.0–52.0)
Hemoglobin: 16.3 g/dL (ref 13.0–17.0)
Lymphocytes Relative: 17.6 % (ref 12.0–46.0)
Lymphs Abs: 1.6 10*3/uL (ref 0.7–4.0)
MCHC: 33.8 g/dL (ref 30.0–36.0)
MCV: 90.2 fl (ref 78.0–100.0)
Monocytes Absolute: 0.5 10*3/uL (ref 0.1–1.0)
Monocytes Relative: 5.8 % (ref 3.0–12.0)
Neutro Abs: 6.8 10*3/uL (ref 1.4–7.7)
Neutrophils Relative %: 74 % (ref 43.0–77.0)
Platelets: 278 10*3/uL (ref 150.0–400.0)
RBC: 5.35 Mil/uL (ref 4.22–5.81)
RDW: 17.2 % — ABNORMAL HIGH (ref 11.5–15.5)
WBC: 9.2 10*3/uL (ref 4.0–10.5)

## 2020-08-04 LAB — CK: Total CK: 147 U/L (ref 7–232)

## 2020-08-04 LAB — VITAMIN B12: Vitamin B-12: 425 pg/mL (ref 211–911)

## 2020-08-04 LAB — TSH: TSH: 1.02 u[IU]/mL (ref 0.35–4.50)

## 2020-08-04 LAB — C-REACTIVE PROTEIN: CRP: 1 mg/dL (ref 0.5–20.0)

## 2020-08-04 LAB — VITAMIN D 25 HYDROXY (VIT D DEFICIENCY, FRACTURES): VITD: 29.18 ng/mL — ABNORMAL LOW (ref 30.00–100.00)

## 2020-08-04 LAB — SEDIMENTATION RATE: Sed Rate: 19 mm/hr (ref 0–20)

## 2020-08-04 NOTE — Patient Instructions (Signed)
Labs ordered- office will call to schedule Plan to follow results  Take care of yourself and keep up with fluids   Alert [place] if new symptoms or worse

## 2020-08-04 NOTE — Assessment & Plan Note (Signed)
See a/p for joint pain  Muscles and joints Labs ordered- plan to follow May need a trial off statin if no reason found

## 2020-08-04 NOTE — Assessment & Plan Note (Signed)
In the setting of muscle and joint pain -esp in shoulder girdle  Labs done  Not seemingly mood related

## 2020-08-04 NOTE — Progress Notes (Signed)
Virtual Visit via Video Note  I connected with Tyler Ramsey on 08/04/20 at 12:00 PM EDT by a video enabled telemedicine application and verified that I am speaking with the correct person using two identifiers.  Location: Patient: home Provider: office   I discussed the limitations of evaluation and management by telemedicine and the availability of in person appointments. The patient expressed understanding and agreed to proceed.  Parties involved in encounter  Patient:Tyler Ramsey   Provider:  Roxy Manns MD    History of Present Illness: 64 yo pt of Dr Patsy Lager presents with fatigue and body aches  He has a lot of fatigue (also has sleep apnea)  Worse past 2 weeks   Joint pain -started about 3 weeks ago (upper body and occ thigh)  Also some pain in muscles in between his joints (esp upper body) No joint swelling or redness or warmth   Unsure if he has OA   Mother had ulcerative colitis  He has no GI problems  Had gastric sleeve procedure a while back  No known vitamin deficiencies   He takes D and mvi and K  Good balanced diet    No new meds px or otc or herbs  Loss of strength (grip/lift) -some pain and some loss of strength  Hands and shoulders more than elbows  occ upper back   Does sit at computer  Some stiffness after inactivity   No headaches  No vision changes    Tried to hydrate more  No fever- temp is always good  bp has been good  Taste and smell     He had covid immunizations in April and may (pfizer)  Has never had covid  Test for covid neg 3 weeks ago at pharmacy  Patient Active Problem List   Diagnosis Date Noted   Fatigue 08/04/2020   Multiple joint pain 08/04/2020   Muscle pain 08/04/2020   Bariatric surgery status 08/04/2020   Major depressive disorder, recurrent episode, moderate (HCC) 12/03/2015   Hypogonadism in male 11/29/2014   FATIGUE 06/23/2010   LIVER FUNCTION TESTS, ABNORMAL, HX OF 09/03/2009    HYPERTENSION 09/24/2008   ALLERGIC RHINITIS 09/24/2008   SLEEP APNEA 09/24/2008   Past Medical History:  Diagnosis Date   Allergy    allergic rhinitis   Depression    Diabetes mellitus    Hyperlipidemia    Hypertension    Hypogonadism in male 11/29/2014   Sleep apnea    Past Surgical History:  Procedure Laterality Date   KNEE ARTHROSCOPY W/ ACL RECONSTRUCTION AND HAMSTRING GRAFT  1985   TONSILLECTOMY  1969   TOTAL KNEE ARTHROPLASTY  5/11   Left, Dr Carma Lair   Social History   Tobacco Use   Smoking status: Never Smoker   Smokeless tobacco: Never Used  Substance Use Topics   Alcohol use: No   Drug use: No   Family History  Problem Relation Age of Onset   Emphysema Father    Heart disease Mother    Allergies  Allergen Reactions   Sulfonamide Derivatives    Morphine And Related Rash   Current Outpatient Medications on File Prior to Visit  Medication Sig Dispense Refill   aspirin 81 MG tablet Take 81 mg by mouth daily.       citalopram (CELEXA) 40 MG tablet Take 1 tablet (40 mg total) by mouth daily. 90 tablet 3   hydrochlorothiazide (HYDRODIURIL) 12.5 MG tablet Take 1 tablet (12.5 mg total) by mouth daily. 90  tablet 3   NEEDLE, DISP, 25 G 25G X 1-1/2" MISC Use as directed 100 each 2   simvastatin (ZOCOR) 10 MG tablet Take 1 tablet (10 mg total) by mouth daily. 90 tablet 3   Syringe, Disposable, 3 ML MISC Use as directed 100 each 2   zolpidem (AMBIEN) 10 MG tablet Take 1 tablet (10 mg total) by mouth at bedtime as needed. for sleep 30 tablet 5   No current facility-administered medications on file prior to visit.   Review of Systems  Constitutional: Positive for malaise/fatigue. Negative for chills, diaphoresis and fever.  HENT: Negative for congestion, ear pain, sinus pain and sore throat.   Eyes: Negative for blurred vision, discharge and redness.  Respiratory: Negative for cough, shortness of breath and stridor.   Cardiovascular:  Negative for chest pain, palpitations and leg swelling.  Gastrointestinal: Negative for abdominal pain, diarrhea, nausea and vomiting.  Musculoskeletal: Positive for joint pain and myalgias.  Skin: Negative for rash.  Neurological: Negative for dizziness and headaches.    Observations/Objective: Patient appears well, in no distress Weight is baseline  No facial swelling or asymmetry Normal voice-not hoarse and no slurred speech No obvious tremor or mobility impairment Moving neck and UEs normally Able to hear the call well  No cough or shortness of breath during interview  Talkative and mentally sharp with no cognitive changes No skin changes on face or neck , no rash or pallor Affect is normal   Some discomfort to make a fist- R hand  No deformity seen of joints in hands   Assessment and Plan: Problem List Items Addressed This Visit      Other   Fatigue    In the setting of muscle and joint pain -esp in shoulder girdle  Labs done  Not seemingly mood related        Relevant Orders   CBC with Differential/Platelet   Comprehensive metabolic panel   TSH   VITAMIN D 25 Hydroxy (Vit-D Deficiency, Fractures)   Vitamin B12   Multiple joint pain - Primary    Mostly UEs - hand/wrist/shoulder  Also muscle pain -shoulder girdle  Disc diff incl OA, auto immune, statin rxn or PMR  Labs done Plan to follow Recommend good self care and hydration        Relevant Orders   Sedimentation Rate   C-reactive protein   Rheumatoid factor   ANA   Muscle pain    See a/p for joint pain  Muscles and joints Labs ordered- plan to follow May need a trial off statin if no reason found      Relevant Orders   Sedimentation Rate   C-reactive protein   CK   Bariatric surgery status    With fatigue  Adding B12 and vit D to labs today      Relevant Orders   VITAMIN D 25 Hydroxy (Vit-D Deficiency, Fractures)   Vitamin B12       Follow Up Instructions: Labs ordered- office will  call to schedule Plan to follow results  Take care of yourself and keep up with fluids    I discussed the assessment and treatment plan with the patient. The patient was provided an opportunity to ask questions and all were answered. The patient agreed with the plan and demonstrated an understanding of the instructions.   The patient was advised to call back or seek an in-person evaluation if the symptoms worsen or if the condition fails to improve as anticipated.  Roxy Manns, MD

## 2020-08-04 NOTE — Assessment & Plan Note (Signed)
With fatigue  Adding B12 and vit D to labs today

## 2020-08-04 NOTE — Assessment & Plan Note (Signed)
Mostly UEs - hand/wrist/shoulder  Also muscle pain -shoulder girdle  Disc diff incl OA, auto immune, statin rxn or PMR  Labs done Plan to follow Recommend good self care and hydration

## 2020-08-06 LAB — ANA: Anti Nuclear Antibody (ANA): NEGATIVE

## 2020-08-06 LAB — RHEUMATOID FACTOR: Rheumatoid fact SerPl-aCnc: <14 IU/mL (ref <14)

## 2020-08-10 ENCOUNTER — Telehealth: Payer: Self-pay | Admitting: *Deleted

## 2020-08-10 NOTE — Telephone Encounter (Signed)
Addressed through result notes  

## 2020-08-10 NOTE — Telephone Encounter (Signed)
Left VM requesting pt to call the office back regarding lab results  

## 2020-08-16 ENCOUNTER — Encounter: Payer: Self-pay | Admitting: Family Medicine

## 2020-08-16 ENCOUNTER — Ambulatory Visit (INDEPENDENT_AMBULATORY_CARE_PROVIDER_SITE_OTHER): Payer: Managed Care, Other (non HMO) | Admitting: Family Medicine

## 2020-08-16 ENCOUNTER — Other Ambulatory Visit: Payer: Self-pay

## 2020-08-16 VITALS — BP 140/70 | HR 71 | Temp 98.5°F | Ht 69.5 in | Wt 239.8 lb

## 2020-08-16 DIAGNOSIS — M791 Myalgia, unspecified site: Secondary | ICD-10-CM | POA: Diagnosis not present

## 2020-08-16 DIAGNOSIS — M2550 Pain in unspecified joint: Secondary | ICD-10-CM | POA: Diagnosis not present

## 2020-08-16 DIAGNOSIS — M659 Synovitis and tenosynovitis, unspecified: Secondary | ICD-10-CM | POA: Diagnosis not present

## 2020-08-16 DIAGNOSIS — R29898 Other symptoms and signs involving the musculoskeletal system: Secondary | ICD-10-CM

## 2020-08-16 DIAGNOSIS — M65941 Unspecified synovitis and tenosynovitis, right hand: Secondary | ICD-10-CM

## 2020-08-16 DIAGNOSIS — R5383 Other fatigue: Secondary | ICD-10-CM

## 2020-08-16 NOTE — Progress Notes (Signed)
Tyler Ramsey T. Tyler Angst, MD, [place] at [facility] [place], [address]  Phone: [phone]   FAX: [place] - 64 y.o. male   MRN [id number]   Date of Birth: May 27, 1956  Date: 08/16/2020   PCP: Owens Loffler, MD   Referral: Owens Loffler, MD  Chief Complaint  Patient presents with   Follow-up    Joint Pain/Weakness    This visit occurred during the SARS-CoV-2 public health emergency.  Safety protocols were in place, including screening questions prior to the visit, additional usage of staff PPE, and extensive cleaning of exam room while observing appropriate contact time as indicated for disinfecting solutions.   Subjective:   Tyler Ramsey is a 64 y.o. very pleasant male patient with Body mass index is 34.9 kg/m. who presents with the following:  Tyler Ramsey is a well-known patient.  He presents today with some ongoing diffuse joint pains predominantly in the upper body and extremities with some swelling in his hands and some significant decrease strength predominantly at his grip.  He did see my partner Dr. Glori Ramsey on August 04, 2020, at that point she did do a basic rheumatological work-up including a negative ESR, negative CRP, negative ANA, and a negative rheumatoid factor.  The patient's symptoms are new, and this is been a new thing over the last 6 weeks.  She has had gout before, but he does not have any red, warm joints, and he does not think that this feels like his prior gout exacerbation.  He will wake up at night and feel generally pretty bad, and he has predominantly pain in his hands and back, that sometimes keeps him up and away.  All of this started at one time, and he has no known injury, no known infectious exposure.  He has had a tick bite.  He does globally feel fatigued.  He did stop his statin, but this made no difference at all in his  symptoms.  He also has a new onset tremor.  Review of Systems is noted in the HPI, as appropriate  Objective:   BP 140/70    Pulse 71    Temp 98.5 F (36.9 C) (Temporal)    Ht 5' 9.5" (1.765 m)    Wt 239 lb 12 oz (108.7 kg)    SpO2 97%    BMI 34.90 kg/m   GEN: No acute distress; alert,appropriate. PULM: Breathing comfortably in no respiratory distress PSYCH: Normally interactive.   Bilateral hand exam: The patient does have some bogginess at the PIP and MCP joints.  He also on the right side does have some effusion at the true wrist joint.  The symptoms are less on the left.  His grip is significantly decreased compared to baseline, and notably fairly poor.  Strength at wrist extension, flexion, elbow flexion, extension as well as all movements of the shoulder are 5/5.  Full range of motion at the shoulder as well as in the elbow.  He does have a new onset bilateral hand tremor.  Sensation is intact throughout the entirety of the upper extremities.  Laboratory and Imaging Data: Results for orders placed or performed in visit on 08/04/20  ANA  Result Value Ref Range   Anti Nuclear Antibody (ANA) NEGATIVE NEGATIVE  Rheumatoid factor  Result Value Ref Range   Rhuematoid fact SerPl-aCnc <14 <14 IU/mL  CK  Result Value Ref Range  Total CK 147 7.0 - 232.0 U/L  C-reactive protein  Result Value Ref Range   CRP 1.0 0.5 - 20.0 mg/dL  Sedimentation Rate  Result Value Ref Range   Sed Rate 19 0 - 20 mm/hr  Vitamin B12  Result Value Ref Range   Vitamin B-12 425 211 - 911 pg/mL  VITAMIN D 25 Hydroxy (Vit-D Deficiency, Fractures)  Result Value Ref Range   VITD 29.18 (L) 30.00 - 100.00 ng/mL  TSH  Result Value Ref Range   TSH 1.02 0.35 - 4.50 uIU/mL  Comprehensive metabolic panel  Result Value Ref Range   Sodium 137 135 - 145 mEq/L   Potassium 4.0 3.5 - 5.1 mEq/L   Chloride 96 96 - 112 mEq/L   CO2 29 19 - 32 mEq/L   Glucose, Bld 133 (H) 70 - 99 mg/dL   BUN 17 6 - 23 mg/dL    Creatinine, Ser 0.99 0.40 - 1.50 mg/dL   Total Bilirubin 0.6 0.2 - 1.2 mg/dL   Alkaline Phosphatase 103 39 - 117 U/L   AST 31 0 - 37 U/L   ALT 36 0 - 53 U/L   Total Protein 7.5 6.0 - 8.3 g/dL   Albumin 4.3 3.5 - 5.2 g/dL   GFR 79.42 >60.00 mL/min   Calcium 9.5 8.4 - 10.5 mg/dL  CBC with Differential/Platelet  Result Value Ref Range   WBC 9.2 4.0 - 10.5 K/uL   RBC 5.35 4.22 - 5.81 Mil/uL   Hemoglobin 16.3 13.0 - 17.0 g/dL   HCT 48.3 39 - 52 %   MCV 90.2 78.0 - 100.0 fl   MCHC 33.8 30.0 - 36.0 g/dL   RDW 17.2 (H) 11.5 - 15.5 %   Platelets 278.0 150 - 400 K/uL   Neutrophils Relative % 74.0 43 - 77 %   Lymphocytes Relative 17.6 12 - 46 %   Monocytes Relative 5.8 3 - 12 %   Eosinophils Relative 2.0 0 - 5 %   Basophils Relative 0.6 0 - 3 %   Neutro Abs 6.8 1.4 - 7.7 K/uL   Lymphs Abs 1.6 0.7 - 4.0 K/uL   Monocytes Absolute 0.5 0.1 - 1.0 K/uL   Eosinophils Absolute 0.2 0.0 - 0.7 K/uL   Basophils Absolute 0.1 0.0 - 0.1 K/uL      Assessment and Plan:     ICD-10-CM   1. Synovitis of right hand  M65.9 Anti-Smith antibody    Anti-DNA antibody, double-stranded    Sjogren's syndrome antibods(ssa + ssb)    B. burgdorfi antibodies by WB    Rocky mtn spotted fvr abs pnl(IgG+IgM)    Ehrlichia antibody panel    Cyclic citrul peptide antibody, IgG    Uric acid    SARS CoV2 Serology(COVID19) AB(IgG,IgM),Immunoassay    Jo-1 antibody-IgG    CANCELED: Angi-Jo 1 antibody, IgG  2. Polyarthralgia  M25.50 Anti-Smith antibody    Anti-DNA antibody, double-stranded    Sjogren's syndrome antibods(ssa + ssb)    B. burgdorfi antibodies by WB    Rocky mtn spotted fvr abs pnl(IgG+IgM)    Ehrlichia antibody panel    Cyclic citrul peptide antibody, IgG    Uric acid    SARS CoV2 Serology(COVID19) AB(IgG,IgM),Immunoassay    Jo-1 antibody-IgG    CANCELED: Angi-Jo 1 antibody, IgG  3. Myalgia  M79.10 Anti-Smith antibody    Anti-DNA antibody, double-stranded    Sjogren's syndrome antibods(ssa +  ssb)    B. burgdorfi antibodies by WB    Rocky mtn spotted  fvr abs pnl(IgG+IgM)    Ehrlichia antibody panel    Cyclic citrul peptide antibody, IgG    Uric acid    SARS CoV2 Serology(COVID19) AB(IgG,IgM),Immunoassay    Jo-1 antibody-IgG    CANCELED: Angi-Jo 1 antibody, IgG  4. Upper extremity weakness  R29.898 Anti-Smith antibody    Anti-DNA antibody, double-stranded    Sjogren's syndrome antibods(ssa + ssb)    B. burgdorfi antibodies by WB    Rocky mtn spotted fvr abs pnl(IgG+IgM)    Ehrlichia antibody panel    Cyclic citrul peptide antibody, IgG    Uric acid    SARS CoV2 Serology(COVID19) AB(IgG,IgM),Immunoassay    Jo-1 antibody-IgG    CANCELED: Angi-Jo 1 antibody, IgG  5. Other fatigue  R53.83 Anti-Smith antibody    Anti-DNA antibody, double-stranded    Sjogren's syndrome antibods(ssa + ssb)    B. burgdorfi antibodies by WB    Rocky mtn spotted fvr abs pnl(IgG+IgM)    Ehrlichia antibody panel    Cyclic citrul peptide antibody, IgG    Uric acid    SARS CoV2 Serology(COVID19) AB(IgG,IgM),Immunoassay    Jo-1 antibody-IgG    CANCELED: Angi-Jo 1 antibody, IgG   Total encounter time: 30 minutes. This includes total time spent on the day of encounter.  Face-to-face, literature review, chart review.  Work-up thus far has been fairly unremarkable.  With new onset quite significant pain, joint swelling, notable fatigue greater than prior experience, I think that he needs more of an in-depth work-up.  I am going to check for tickborne illness.  I am also going to broaden his rheumatological work-up.  Further plan of care will depend upon outcomes.  If his entire rheumatological work-up is negative, then I think getting neurology involved would be reasonable given his significantly decreased strength that his grip.  No orders of the defined types were placed in this encounter.  There are no discontinued medications. Orders Placed This Encounter  Procedures   SARS CoV2  Serology(COVID19) AB(IgG,IgM),Immunoassay   Anti-Smith antibody   Anti-DNA antibody, double-stranded   Sjogren's syndrome antibods(ssa + ssb)   B. burgdorfi antibodies by WB   Rocky mtn spotted fvr abs pnl(IgG+IgM)   Ehrlichia antibody panel   Cyclic citrul peptide antibody, IgG   Uric acid   Jo-1 antibody-IgG    Follow-up: No follow-ups on file.  Signed,  Maud Deed. Johnhenry Tippin, MD   Outpatient Encounter Medications as of 08/16/2020  Medication Sig   aspirin 81 MG tablet Take 81 mg by mouth daily.     citalopram (CELEXA) 40 MG tablet Take 1 tablet (40 mg total) by mouth daily.   hydrochlorothiazide (HYDRODIURIL) 12.5 MG tablet Take 1 tablet (12.5 mg total) by mouth daily.   NEEDLE, DISP, 25 G 25G X 1-1/2" MISC Use as directed   simvastatin (ZOCOR) 10 MG tablet Take 1 tablet (10 mg total) by mouth daily.   Syringe, Disposable, 3 ML MISC Use as directed   zolpidem (AMBIEN) 10 MG tablet Take 1 tablet (10 mg total) by mouth at bedtime as needed. for sleep   No facility-administered encounter medications on file as of 08/16/2020.

## 2020-08-17 LAB — JO-1 ANTIBODY-IGG: Jo-1 Autoabs: <1 AI

## 2020-08-17 LAB — URIC ACID: Uric Acid, Serum: 7.4 mg/dL (ref 4.0–7.8)

## 2020-08-24 LAB — B. BURGDORFI ANTIBODIES BY WB

## 2020-08-24 LAB — ANTI-SMITH ANTIBODY: ENA SM Ab Ser-aCnc: <1 AI

## 2020-08-24 LAB — CYCLIC CITRUL PEPTIDE ANTIBODY, IGG: Cyclic Citrullin Peptide Ab: <16 UNITS

## 2020-08-24 LAB — ROCKY MTN SPOTTED FVR ABS PNL(IGG+IGM)
RMSF IgG: NOT DETECTED
RMSF IgM: NOT DETECTED

## 2020-08-24 LAB — SJOGREN'S SYNDROME ANTIBODS(SSA + SSB)
SSA (Ro) (ENA) Antibody, IgG: <1 AI
SSB (La) (ENA) Antibody, IgG: <1 AI

## 2020-08-24 LAB — EHRLICHIA ANTIBODY PANEL
E. CHAFFEENSIS AB IGG: <1:64 {titer}
E. CHAFFEENSIS AB IGM: <1:20 {titer}

## 2020-08-24 LAB — SARS COV-2 SEROLOGY(COVID-19)AB(IGG,IGM),IMMUNOASSAY
SARS CoV-2 AB IgG: NEGATIVE
SARS CoV-2 IgM: NEGATIVE

## 2020-08-24 LAB — ANTI-DNA ANTIBODY, DOUBLE-STRANDED: ds DNA Ab: <1 IU/mL

## 2020-08-27 ENCOUNTER — Other Ambulatory Visit: Payer: Self-pay | Admitting: Family Medicine

## 2020-08-27 ENCOUNTER — Encounter: Payer: Self-pay | Admitting: Family Medicine

## 2020-08-27 DIAGNOSIS — R251 Tremor, unspecified: Secondary | ICD-10-CM

## 2020-08-27 DIAGNOSIS — R5383 Other fatigue: Secondary | ICD-10-CM

## 2020-08-27 DIAGNOSIS — M791 Myalgia, unspecified site: Secondary | ICD-10-CM

## 2020-08-27 DIAGNOSIS — R29898 Other symptoms and signs involving the musculoskeletal system: Secondary | ICD-10-CM

## 2020-09-27 ENCOUNTER — Ambulatory Visit (INDEPENDENT_AMBULATORY_CARE_PROVIDER_SITE_OTHER): Payer: Managed Care, Other (non HMO) | Admitting: Neurology

## 2020-09-27 ENCOUNTER — Encounter: Payer: Self-pay | Admitting: Neurology

## 2020-09-27 VITALS — BP 166/87 | HR 56 | Ht 69.5 in | Wt 234.5 lb

## 2020-09-27 DIAGNOSIS — R269 Unspecified abnormalities of gait and mobility: Secondary | ICD-10-CM | POA: Diagnosis not present

## 2020-09-27 DIAGNOSIS — R202 Paresthesia of skin: Secondary | ICD-10-CM | POA: Insufficient documentation

## 2020-09-27 MED ORDER — DULOXETINE HCL 60 MG PO CPEP: 60.0000 mg | ORAL_CAPSULE | Freq: Every day | ORAL | 11 refills | Status: DC

## 2020-09-27 NOTE — Progress Notes (Signed)
Chief Complaint  Patient presents with  . New Patient (Initial Visit)    He is here with his significant other, Cathy. Reports pain throughout body and swelling in his hands. He is concerned about fibromyalgia. His symptoms started in October 2021.   Marland Kitchen PCP    Copland, Frederico Hamman, MD    HISTORICAL  Tyler Ramsey is a 64 year old male, seen in request by her primary care doctor Copland, Frederico Hamman for evaluation of diffuse body achy pain, swelling in his hands, initial evaluation was on September 27, 2020.  I reviewed and summarized the referring note.  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 Edilia Bo for persistent symptoms, he complains of difficulty ambulating because diffuse body achy pain, right side low back pain, radiating pain to bilateral hip,  Also complains of bilateral upper and lower extremity paresthesia, mildly unsteady gait, difficulty opening a bottle  He had worsening depression, does not feel interested in doing anything, and poor sleep quality, woke up frequently.  Laboratory evaluation in Oct 2021: Normal or negative Jo-1 antibody, SARSCov 2, uric acid,[place] spotted fever Lyme titer, Sjogren, DNA, CBC, CMP, TSH, vitamin D, B12, ESR, C-reactive protein, CPK, rheumatoid factor, ANA   REVIEW OF SYSTEMS: Full 14 system review of systems performed and notable only for as above All other review of systems were negative.  ALLERGIES: Allergies  Allergen Reactions  . Sulfonamide Derivatives   . Morphine And Related Rash    HOME MEDICATIONS: Current Outpatient Medications  Medication Sig Dispense Refill  . aspirin 81 MG tablet  Take 81 mg by mouth daily.      . citalopram (CELEXA) 40 MG tablet Take 1 tablet (40 mg total) by mouth daily. 90 tablet 3  . hydrochlorothiazide (HYDRODIURIL) 12.5 MG tablet Take 1 tablet (12.5 mg total) by mouth daily. 90 tablet 3  . NEEDLE, DISP, 25 G 25G X 1-1/2" MISC Use as directed 100 each 2  . simvastatin (ZOCOR) 10 MG tablet Take 1 tablet (10 mg total) by mouth daily. 90 tablet 3  . Syringe, Disposable, 3 ML MISC Use as directed 100 each 2  . zolpidem (AMBIEN) 10 MG tablet Take 1 tablet (10 mg total) by mouth at bedtime as needed. for sleep 30 tablet 5   No current facility-administered medications for this visit.    PAST MEDICAL HISTORY: Past Medical History:  Diagnosis Date  . Allergy    allergic rhinitis  . Depression   . Diabetes mellitus   . Hyperlipidemia   . Hypertension   . Hypogonadism in male 11/29/2014  . Sleep apnea     PAST SURGICAL HISTORY: Past Surgical History:  Procedure Laterality Date  . KNEE ARTHROSCOPY W/ ACL RECONSTRUCTION AND HAMSTRING GRAFT  1985  . TONSILLECTOMY  1969  . TOTAL KNEE ARTHROPLASTY  5/11   Left, Dr Coralee North    FAMILY HISTORY: Family History  Problem Relation Age of Onset  . Emphysema Father   . Heart disease Mother     SOCIAL HISTORY: Social History   Socioeconomic History  . Marital status: Divorced    Spouse name: Not on file  . Number of children: 1  . Years of  education: college  . Highest education level: Not on file  Occupational History  . 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 Smoking status: Never Smoker  . Smokeless tobacco: Never Used  Substance and Sexual Activity  . Alcohol use: Yes    Comment: occasional  . Drug use: No  . Sexual activity: Not on file  Other Topics Concern  . Not on file  Social History Narrative   Lived in [place], moved to [place] and plans to settle in [place].   Lives with significant other.   Right-handed.   No daily use of caffeine.    Social Determinants of Health   Financial Resource Strain:   . Difficulty of Paying Living Expenses: Not on file  Food Insecurity:   . Worried About Charity fundraiser in the Last Year: Not on file  . Ran Out of Food in the Last Year: Not on file  Transportation Needs:   . Lack of Transportation (Medical): Not on file  . Lack of Transportation (Non-Medical): Not on file  Physical Activity:   . Days of Exercise per Week: Not on file  . Minutes of Exercise per Session: Not on file  Stress:   . Feeling of Stress : Not on file  Social Connections:   . Frequency of Communication with Friends and Family: Not on file  . Frequency of Social Gatherings with Friends and Family: Not on file  . Attends Religious Services: Not on file  . Active Member of Clubs or Organizations: Not on file  . Attends Archivist Meetings: Not on file  . Marital Status: Not on file  Intimate Partner Violence:   . Fear of Current or Ex-Partner: Not on file  . Emotionally Abused: Not on file  . Physically Abused: Not on file  . Sexually Abused: Not on file     PHYSICAL EXAM   Vitals:   09/27/20 1121  BP: (!) 166/87  Pulse: (!) 56  Weight: 234 lb 8 oz (106.4 kg)  Height: 5' 9.5" (1.765 m)   Not recorded     Body mass index is 34.13 kg/m.  PHYSICAL EXAMNIATION:  Gen: NAD, conversant, well nourised, well groomed                     Cardiovascular: Regular rate rhythm, no peripheral edema, warm, nontender. Eyes: Conjunctivae clear without exudates or hemorrhage Neck: Supple, no carotid bruits. Pulmonary: Clear to auscultation bilaterally   NEUROLOGICAL EXAM:  MENTAL STATUS: Speech:    Speech is normal; fluent and spontaneous with normal comprehension.  Cognition:     Orientation to time, place and person     Normal recent and remote memory     Normal Attention span and concentration     Normal Language, naming, repeating,spontaneous speech     Fund of knowledge   CRANIAL  NERVES: CN II: Visual fields are full to confrontation. Pupils are round equal and briskly reactive to light. CN III, IV, VI: extraocular movement are normal. No ptosis. CN V: Facial sensation is intact to light touch CN VII: Face is symmetric with normal eye closure  CN VIII: Hearing is normal to causal conversation. CN IX, X: Phonation is normal. CN XI: Head turning and shoulder shrug are intact  MOTOR: There is no pronator drift of out-stretched arms. Muscle bulk and tone are normal. Muscle strength is normal.  REFLEXES: Absent  SENSORY: Length dependent decreased vibratory sensation, pinprick light touch to distal  shin level  COORDINATION: There is no trunk or limb dysmetria noted.  GAIT/STANCE: He can get up from seated position arm crossed, steady, difficulty with tandem walking, Romberg sign was mildly positive  DIAGNOSTIC DATA (LABS, IMAGING, TESTING) - I reviewed patient records, labs, notes, testing and imaging myself where available.   ASSESSMENT AND PLAN  Tyler Ramsey is a 64 y.o. male   Acute onset diffuse body achy pain, bilateral upper and lower extremity paresthesia, also have subjective gait abnormality  Mildly length dependent sensory changes, positive Romberg signs  EMG/NCS to rule out demyelinating neuropathy  Cymbalta 76m daily    YMarcial Pacas M.D. Ph.D.  [facility] [address] [place] [address]Ph: [phone]: [phone] CC:  [place]  Tyler Ramsey [id number]

## 2020-10-04 ENCOUNTER — Ambulatory Visit (INDEPENDENT_AMBULATORY_CARE_PROVIDER_SITE_OTHER): Payer: Medicare Other | Admitting: Neurology

## 2020-10-04 ENCOUNTER — Ambulatory Visit (INDEPENDENT_AMBULATORY_CARE_PROVIDER_SITE_OTHER): Payer: Managed Care, Other (non HMO) | Admitting: Neurology

## 2020-10-04 DIAGNOSIS — R269 Unspecified abnormalities of gait and mobility: Secondary | ICD-10-CM

## 2020-10-04 DIAGNOSIS — R202 Paresthesia of skin: Secondary | ICD-10-CM

## 2020-10-04 DIAGNOSIS — Z0289 Encounter for other administrative examinations: Secondary | ICD-10-CM

## 2020-10-04 DIAGNOSIS — G629 Polyneuropathy, unspecified: Secondary | ICD-10-CM | POA: Insufficient documentation

## 2020-10-04 DIAGNOSIS — G5603 Carpal tunnel syndrome, bilateral upper limbs: Secondary | ICD-10-CM

## 2020-10-04 DIAGNOSIS — G6289 Other specified polyneuropathies: Secondary | ICD-10-CM

## 2020-10-04 NOTE — Procedures (Signed)
        Full Name: Tyler Ramsey Gender: Male MRN #: [id number] Date of Birth: Oct 30, 1955    Visit Date: 10/04/2020 09:10 Age: 64 Years History: 64 year old male, complains few months history of bilateral finger, and hand joints pain, right knee pain, stiffness while walking  Summary of the test: Nerve conduction study: Right sural showed mildly prolonged peak latency, with significantly decreased snap amplitude; right superficial peroneal sensory response was absent.  Bilateral median sensory response showed mildly prolonged peak latency, with moderately decreased snap amplitude.  Right ulnar sensory response was normal.  Bilateral median motor responses showed mildly prolonged distal latency, with normal CMAP amplitude.  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  Right ulnar, peroneal to EDB motor responses were normal.  Right tibial motor response showed significantly decreased CMAP amplitude, with moderately slowed conduction velocity.  Electromyography:  Selected needle examination of bilateral lower extremity muscles, bilateral lumbosacral paraspinal muscles; right upper extremity muscles, and right cervical paraspinal muscles were normal.  Conclusion: This is an abnormal study.  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  In addition, there is evidence of moderate bilateral median neuropathy across the wrist, consistent with moderate bilateral carpal tunnel syndromes.    ------------------------------- Levert Feinstein, M.D. PhD  [facility] [address] [place], [place] [address] Tel: [phone] Fax: [phone]  Verbal informed consent was obtained from the patient, patient was informed of potential risk of procedure, including bruising, bleeding, hematoma formation, infection, muscle weakness, muscle pain, numbness, among others.          EMG Summary Table    Spontaneous MUAP Recruitment  Muscle IA Fib PSW Fasc Other Amp Dur. Poly Pattern  R. Tibialis anterior Normal None None None _______ Normal Normal Normal Normal  R. Tibialis posterior Normal None None None _______ Normal Normal Normal Normal  R. Peroneus longus Normal None None None _______ Normal Normal Normal Normal  R. Gastrocnemius (Medial head) Normal None None None _______ Normal Normal Normal Normal  R. Vastus lateralis Normal None None None _______ Normal Normal Normal Normal  L. Tibialis anterior Normal None None None _______ Normal Normal Normal Normal  L. Tibialis posterior Normal None None None _______ Normal Normal Normal Normal  L. Gastrocnemius (Medial head) Normal None None None _______ Normal Normal Normal Normal  L. Vastus lateralis Normal None None None _______ Normal Normal Normal Normal  R. Lumbar paraspinals (low) Normal None None None _______ Normal Normal Normal Normal  R. Lumbar paraspinals (mid) Normal None None None _______ Normal Normal Normal Normal  L. Lumbar paraspinals (low) Normal None None None _______ Normal Normal Normal Normal  L. Lumbar paraspinals (mid) Normal None None None _______ Normal Normal Normal Normal  R. First dorsal interosseous Normal None None None _______ Normal Normal Normal Normal  R. Brachioradialis Normal None None None _______ Normal Normal Normal Normal  R. Pronator teres Normal None None None _______ Normal Normal Normal Normal  R. Biceps brachii Normal None None None _______ Normal Normal Normal Normal  R. Deltoid Normal None None None _______ Normal Normal Normal Normal  R. Triceps brachii Normal None None None _______ Normal Normal Normal Normal  R. Cervical paraspinals Normal None None None _______ Normal Normal Normal Normal

## 2020-10-05 ENCOUNTER — Telehealth: Payer: Self-pay | Admitting: *Deleted

## 2020-10-05 DIAGNOSIS — R251 Tremor, unspecified: Secondary | ICD-10-CM

## 2020-10-05 DIAGNOSIS — R2990 Unspecified symptoms and signs involving the nervous system: Secondary | ICD-10-CM

## 2020-10-05 DIAGNOSIS — M791 Myalgia, unspecified site: Secondary | ICD-10-CM

## 2020-10-05 DIAGNOSIS — M2550 Pain in unspecified joint: Secondary | ICD-10-CM

## 2020-10-05 NOTE — Telephone Encounter (Signed)
done

## 2020-10-05 NOTE — Telephone Encounter (Signed)
Patient called and states that he wants a second opinion for Neurology - pt was not pleased with this experience at [facility]. Pt is requesting a new referral be placed for Dr Charmayne Sheer [facility] Neurology [facility] location)  Pt is requesting a call once the referral is placed.

## 2020-10-05 NOTE — Telephone Encounter (Signed)
Pt is aware that referral has been placed.  

## 2020-12-15 ENCOUNTER — Other Ambulatory Visit: Payer: Self-pay

## 2020-12-15 ENCOUNTER — Other Ambulatory Visit (INDEPENDENT_AMBULATORY_CARE_PROVIDER_SITE_OTHER): Payer: Managed Care, Other (non HMO)

## 2020-12-15 DIAGNOSIS — Z131 Encounter for screening for diabetes mellitus: Secondary | ICD-10-CM | POA: Diagnosis not present

## 2020-12-15 DIAGNOSIS — E785 Hyperlipidemia, unspecified: Secondary | ICD-10-CM | POA: Diagnosis not present

## 2020-12-15 DIAGNOSIS — Z79899 Other long term (current) drug therapy: Secondary | ICD-10-CM | POA: Diagnosis not present

## 2020-12-15 DIAGNOSIS — Z125 Encounter for screening for malignant neoplasm of prostate: Secondary | ICD-10-CM

## 2020-12-15 LAB — HEPATIC FUNCTION PANEL
ALT: 41 U/L (ref 0–53)
AST: 40 U/L — ABNORMAL HIGH (ref 0–37)
Albumin: 4.7 g/dL (ref 3.5–5.2)
Alkaline Phosphatase: 100 U/L (ref 39–117)
Bilirubin, Direct: 0.2 mg/dL (ref 0.0–0.3)
Total Bilirubin: 0.8 mg/dL (ref 0.2–1.2)
Total Protein: 8.1 g/dL (ref 6.0–8.3)

## 2020-12-15 LAB — BASIC METABOLIC PANEL
BUN: 9 mg/dL (ref 6–23)
CO2: 34 mEq/L — ABNORMAL HIGH (ref 19–32)
Calcium: 10 mg/dL (ref 8.4–10.5)
Chloride: 97 mEq/L (ref 96–112)
Creatinine, Ser: 1.07 mg/dL (ref 0.40–1.50)
GFR: 72.73 mL/min (ref >60.00)
Glucose, Bld: 124 mg/dL — ABNORMAL HIGH (ref 70–99)
Potassium: 4.1 mEq/L (ref 3.5–5.1)
Sodium: 137 mEq/L (ref 135–145)

## 2020-12-15 LAB — LIPID PANEL
Cholesterol: 161 mg/dL (ref 0–200)
HDL: 53.7 mg/dL (ref >39.00)
LDL Cholesterol: 86 mg/dL (ref 0–99)
NonHDL: 107.35
Total CHOL/HDL Ratio: 3
Triglycerides: 109 mg/dL (ref 0.0–149.0)
VLDL: 21.8 mg/dL (ref 0.0–40.0)

## 2020-12-15 LAB — CBC WITH DIFFERENTIAL/PLATELET
Basophils Absolute: 0.1 10*3/uL (ref 0.0–0.1)
Basophils Relative: 0.9 % (ref 0.0–3.0)
Eosinophils Absolute: 0.3 10*3/uL (ref 0.0–0.7)
Eosinophils Relative: 5.6 % — ABNORMAL HIGH (ref 0.0–5.0)
HCT: 50.3 % (ref 39.0–52.0)
Hemoglobin: 16.9 g/dL (ref 13.0–17.0)
Lymphocytes Relative: 31.7 % (ref 12.0–46.0)
Lymphs Abs: 2 10*3/uL (ref 0.7–4.0)
MCHC: 33.5 g/dL (ref 30.0–36.0)
MCV: 88.8 fl (ref 78.0–100.0)
Monocytes Absolute: 0.6 10*3/uL (ref 0.1–1.0)
Monocytes Relative: 9.3 % (ref 3.0–12.0)
Neutro Abs: 3.3 10*3/uL (ref 1.4–7.7)
Neutrophils Relative %: 52.5 % (ref 43.0–77.0)
Platelets: 277 10*3/uL (ref 150.0–400.0)
RBC: 5.66 Mil/uL (ref 4.22–5.81)
RDW: 15.4 % (ref 11.5–15.5)
WBC: 6.3 10*3/uL (ref 4.0–10.5)

## 2020-12-15 LAB — HEMOGLOBIN A1C: Hgb A1c MFr Bld: 5.8 % (ref 4.6–6.5)

## 2020-12-16 LAB — PSA, TOTAL WITH REFLEX TO PSA, FREE: PSA, Total: 1.1 ng/mL (ref <=4.0)

## 2020-12-22 ENCOUNTER — Encounter: Payer: Self-pay | Admitting: Family Medicine

## 2020-12-22 ENCOUNTER — Ambulatory Visit (INDEPENDENT_AMBULATORY_CARE_PROVIDER_SITE_OTHER): Payer: Managed Care, Other (non HMO) | Admitting: Family Medicine

## 2020-12-22 ENCOUNTER — Other Ambulatory Visit: Payer: Self-pay

## 2020-12-22 VITALS — BP 130/80 | HR 72 | Temp 95.0°F | Ht 69.5 in | Wt 247.5 lb

## 2020-12-22 DIAGNOSIS — Z0000 Encounter for general adult medical examination without abnormal findings: Secondary | ICD-10-CM

## 2020-12-22 DIAGNOSIS — Z23 Encounter for immunization: Secondary | ICD-10-CM

## 2020-12-22 MED ORDER — DULOXETINE HCL 60 MG PO CPEP: 60.0000 mg | ORAL_CAPSULE | Freq: Every day | ORAL | 3 refills | Status: DC

## 2020-12-22 MED ORDER — HYDROCHLOROTHIAZIDE 12.5 MG PO TABS: 12.5000 mg | ORAL_TABLET | Freq: Every day | ORAL | 3 refills | Status: DC

## 2020-12-22 MED ORDER — CITALOPRAM HYDROBROMIDE 40 MG PO TABS: 40.0000 mg | ORAL_TABLET | Freq: Every day | ORAL | 3 refills | Status: DC

## 2020-12-22 MED ORDER — SIMVASTATIN 10 MG PO TABS: 10.0000 mg | ORAL_TABLET | Freq: Every day | ORAL | 3 refills | Status: DC

## 2020-12-22 MED ORDER — ZOLPIDEM TARTRATE 10 MG PO TABS: 10.0000 mg | ORAL_TABLET | Freq: Every evening | ORAL | 5 refills | Status: DC | PRN

## 2020-12-22 NOTE — Progress Notes (Signed)
Rishard Delange T. Anjeli Casad, MD, CAQ Sports Medicine  Primary Care and Sports Medicine [facility] at [facility] [address] [place], [address]  Phone: [phone]  FAX: [phone]  Tyler Ramsey - 65 y.o. male  MRN [id number]  Date of Birth: [birth year]  Date: 12/22/2020  PCP: Hannah Beat, MD  Referral: Hannah Beat, MD  Chief Complaint  Patient presents with  . Annual Exam    This visit occurred during the SARS-CoV-2 public health emergency.  Safety protocols were in place, including screening questions prior to the visit, additional usage of staff PPE, and extensive cleaning of exam room while observing appropriate contact time as indicated for disinfecting solutions.   Patient Care Team: Hannah Beat, MD as PCP - General Subjective:   Tyler Ramsey is a 65 y.o. pleasant patient who presents for a medicare wellness examination:  Preventative Health Maintenance Visit:  Health Maintenance Summary Reviewed and updated, unless pt declines services.  Tobacco History Reviewed. Alcohol: No concerns, no excessive use Exercise Habits: some yard work STD concerns: no risk or activity to increase risk Drug Use: None  He is doing well.  Much better since last time I saw him, and he had quite a number of symptoms that were work-up by different doctors, and ultimately they have completely resolved at this time.  Wt Readings from Last 3 Encounters:  12/22/20 247 lb 8 oz (112.3 kg)  09/27/20 234 lb 8 oz (106.4 kg)  08/16/20 239 lb 12 oz (108.7 kg)     Health Maintenance  Topic Date Due  . COLONOSCOPY (Pts 45-87yrs Insurance coverage will need to be confirmed)  10/23/2016  . INFLUENZA VACCINE  01/20/2021 (Originally 05/23/2020)  . PNA vac Low Risk Adult (2 of 2 - PPSV23) 12/22/2021  . TETANUS/TDAP  10/18/2023  . COVID-19 Vaccine  Completed  . Hepatitis C Screening  Completed  . HIV Screening  Completed  . HPV VACCINES  Aged Out     Immunization History  Administered Date(s) Administered  . Influenza Split 08/12/2012  . Influenza-Unspecified 10/23/2016  . PFIZER(Purple Top)SARS-COV-2 Vaccination 02/05/2020, 03/09/2020, 10/17/2020  . Pneumococcal Conjugate-13 12/22/2020  . Pneumococcal Polysaccharide-23 12/01/2015  . Tdap 10/17/2013  . Zoster 12/01/2015  . Zoster Recombinat (Shingrix) 11/19/2020    Patient Active Problem List   Diagnosis Date Noted  . HYPERTENSION 09/24/2008    Priority: High  . SLEEP APNEA 09/24/2008    Priority: Medium  . Bilateral carpal tunnel syndrome 10/04/2020  . Peripheral neuropathy 10/04/2020  . Bariatric surgery status 08/04/2020  . Major depressive disorder, recurrent episode, moderate (HCC) 12/03/2015  . Hypogonadism in male 11/29/2014  . LIVER FUNCTION TESTS, ABNORMAL, HX OF 09/03/2009  . ALLERGIC RHINITIS 09/24/2008    Past Medical History:  Diagnosis Date  . Allergy    allergic rhinitis  . Depression   . Diabetes mellitus   . Hyperlipidemia   . Hypertension   . Hypogonadism in male 11/29/2014  . Sleep apnea     Past Surgical History:  Procedure Laterality Date  . KNEE ARTHROSCOPY W/ ACL RECONSTRUCTION AND HAMSTRING GRAFT  1985  . TONSILLECTOMY  1969  . TOTAL KNEE ARTHROPLASTY  5/11   Left, Dr Carma Lair    Family History  Problem Relation Age of Onset  . Emphysema Father   . Heart disease Mother     Past Medical History, Surgical History, Social History, Family History, Problem List, Medications, and Allergies have been reviewed and updated if relevant.  Review of Systems: Pertinent positives are listed above.  Otherwise, a full 14 point review of systems has been done in full and it is negative except where it is noted positive.  Objective:   BP 130/80   Pulse 72   Temp (!) 95 F (35 C) (Temporal)   Ht 5' 9.5" (1.765 m)   Wt 247 lb 8 oz (112.3 kg)   SpO2 96%   BMI 36.03 kg/m  No flowsheet data found. Ideal Body Weight: Weight in (lb) to  have BMI = 25: 171.4 No exam data present Depression screen [facility] 2/9 12/22/2020 12/30/2018 12/24/2017  Decreased Interest 0 0 0  Down, Depressed, Hopeless 0 0 0  PHQ - 2 Score 0 0 0     GEN: well developed, well nourished, no acute distress Eyes: conjunctiva and lids normal, PERRLA, EOMI ENT: TM clear, nares clear, oral exam WNL Neck: supple, no lymphadenopathy, no thyromegaly, no JVD Pulm: clear to auscultation and percussion, respiratory effort normal CV: regular rate and rhythm, S1-S2, no murmur, rub or gallop, no bruits, peripheral pulses normal and symmetric, no cyanosis, clubbing, edema or varicosities GI: soft, non-tender; no hepatosplenomegaly, masses; active bowel sounds all quadrants GU: deferred Lymph: no cervical, axillary or inguinal adenopathy MSK: gait normal, muscle tone and strength WNL, no joint swelling, effusions, discoloration, crepitus  SKIN: clear, good turgor, color WNL, no rashes, lesions, or ulcerations Neuro: normal mental status, normal strength, sensation, and motion Psych: alert; oriented to person, place and time, normally interactive and not anxious or depressed in appearance.  All labs reviewed with patient.  Results for orders placed or performed in visit on 12/15/20  Lipid panel  Result Value Ref Range   Cholesterol 161 0 - 200 mg/dL   Triglycerides 856.3 0.0 - 149.0 mg/dL   HDL 14.97 >02.63 mg/dL   VLDL 78.5 0.0 - 88.5 mg/dL   LDL Cholesterol 86 0 - 99 mg/dL   Total CHOL/HDL Ratio 3    NonHDL 107.35   Hepatic function panel  Result Value Ref Range   Total Bilirubin 0.8 0.2 - 1.2 mg/dL   Bilirubin, Direct 0.2 0.0 - 0.3 mg/dL   Alkaline Phosphatase 100 39 - 117 U/L   AST 40 (H) 0 - 37 U/L   ALT 41 0 - 53 U/L   Total Protein 8.1 6.0 - 8.3 g/dL   Albumin 4.7 3.5 - 5.2 g/dL  Basic metabolic panel  Result Value Ref Range   Sodium 137 135 - 145 mEq/L   Potassium 4.1 3.5 - 5.1 mEq/L   Chloride 97 96 - 112 mEq/L   CO2 34 (H) 19 - 32 mEq/L    Glucose, Bld 124 (H) 70 - 99 mg/dL   BUN 9 6 - 23 mg/dL   Creatinine, Ser 0.27 0.40 - 1.50 mg/dL   GFR 74.12 >87.86 mL/min   Calcium 10.0 8.4 - 10.5 mg/dL  CBC with Differential/Platelet  Result Value Ref Range   WBC 6.3 4.0 - 10.5 K/uL   RBC 5.66 4.22 - 5.81 Mil/uL   Hemoglobin 16.9 13.0 - 17.0 g/dL   HCT 76.7 20.9 - 47.0 %   MCV 88.8 78.0 - 100.0 fl   MCHC 33.5 30.0 - 36.0 g/dL   RDW 96.2 83.6 - 62.9 %   Platelets 277.0 150.0 - 400.0 K/uL   Neutrophils Relative % 52.5 43.0 - 77.0 %   Lymphocytes Relative 31.7 12.0 - 46.0 %   Monocytes Relative 9.3 3.0 - 12.0 %   Eosinophils  Relative 5.6 (H) 0.0 - 5.0 %   Basophils Relative 0.9 0.0 - 3.0 %   Neutro Abs 3.3 1.4 - 7.7 K/uL   Lymphs Abs 2.0 0.7 - 4.0 K/uL   Monocytes Absolute 0.6 0.1 - 1.0 K/uL   Eosinophils Absolute 0.3 0.0 - 0.7 K/uL   Basophils Absolute 0.1 0.0 - 0.1 K/uL  Hemoglobin A1c  Result Value Ref Range   Hgb A1c MFr Bld 5.8 4.6 - 6.5 %  PSA, Total with Reflex to PSA, Free  Result Value Ref Range   PSA, Total 1.1 < OR = 4.0 ng/mL    Assessment and Plan:     ICD-10-CM   1. Healthcare maintenance  Z00.00   2. Need for vaccination with 13-polyvalent pneumococcal conjugate vaccine  Z23 Pneumococcal conjugate vaccine 13-valent   He is globally doing well.  He does note that his weight is creeped up a little bit.  At this point, he has been a hold off on getting his colonoscopy.  Aside from this, I encouraged him to be active and work on his weight  Health Maintenance Exam: The patient's preventative maintenance and recommended screening tests for an annual wellness exam were reviewed in full today. Brought up to date unless services declined.  Counselled on the importance of diet, exercise, and its role in overall health and mortality. The patient's FH and SH was reviewed, including their home life, tobacco status, and drug and alcohol status.  Follow-up in 1 year for physical exam or additional follow-up  below.  I have personally reviewed the Medicare Annual Wellness questionnaire and have noted 1. The patient's medical and social history 2. Their use of alcohol, tobacco or illicit drugs 3. Their current medications and supplements 4. The patient's functional ability including ADL's, fall risks, home safety risks and hearing or visual             impairment. 5. Diet and physical activities 6. Evidence for depression or mood disorders 7. Reviewed Updated provider list, see scanned forms and [facility] Snapshot.  8. Reviewed whether or not the patient has HCPOA or living will, and discussed what this means with the patient.  Recommended he bring in a copy for his chart in [facility].  The patients weight, height, BMI and visual acuity have been recorded in the chart I have made referrals, counseling and provided education to the patient based review of the above and I have provided the pt with a written personalized care plan for preventive services.  I have provided the patient with a copy of your personalized plan for preventive services. Instructed to take the time to review along with their updated medication list.  Follow-up: No follow-ups on file. Or follow-up in 1 year if not noted.  No future appointments.  Meds ordered this encounter  Medications  . zolpidem (AMBIEN) 10 MG tablet    Sig: Take 1 tablet (10 mg total) by mouth at bedtime as needed. for sleep    Dispense:  30 tablet    Refill:  5  . simvastatin (ZOCOR) 10 MG tablet    Sig: Take 1 tablet (10 mg total) by mouth daily.    Dispense:  90 tablet    Refill:  3  . hydrochlorothiazide (HYDRODIURIL) 12.5 MG tablet    Sig: Take 1 tablet (12.5 mg total) by mouth daily.    Dispense:  90 tablet    Refill:  3  . DULoxetine (CYMBALTA) 60 MG capsule    Sig: Take 1  capsule (60 mg total) by mouth daily.    Dispense:  90 capsule    Refill:  3  . citalopram (CELEXA) 40 MG tablet    Sig: Take 1 tablet (40 mg total) by mouth daily.     Dispense:  90 tablet    Refill:  3   Medications Discontinued During This Encounter  Medication Reason  . zolpidem (AMBIEN) 10 MG tablet Reorder  . simvastatin (ZOCOR) 10 MG tablet Reorder  . hydrochlorothiazide (HYDRODIURIL) 12.5 MG tablet Reorder  . citalopram (CELEXA) 40 MG tablet Reorder  . DULoxetine (CYMBALTA) 60 MG capsule Reorder   Orders Placed This Encounter  Procedures  . Pneumococcal conjugate vaccine 13-valent    Signed,  Nyeli Holtmeyer T. Bijon Mineer, MD   Allergies as of 12/22/2020      Reactions   Sulfonamide Derivatives    Morphine And Related Rash      Medication List       Accurate as of December 22, 2020 11:59 PM. If you have any questions, ask your nurse or doctor.        aspirin 81 MG tablet Take 81 mg by mouth daily.   citalopram 40 MG tablet Commonly known as: CELEXA Take 1 tablet (40 mg total) by mouth daily.   DULoxetine 60 MG capsule Commonly known as: Cymbalta Take 1 capsule (60 mg total) by mouth daily.   hydrochlorothiazide 12.5 MG tablet Commonly known as: HYDRODIURIL Take 1 tablet (12.5 mg total) by mouth daily.   NEEDLE (DISP) 25 G 25G X 1-1/2" Misc Use as directed   simvastatin 10 MG tablet Commonly known as: ZOCOR Take 1 tablet (10 mg total) by mouth daily.   Syringe (Disposable) 3 ML Misc Use as directed   zolpidem 10 MG tablet Commonly known as: AMBIEN Take 1 tablet (10 mg total) by mouth at bedtime as needed. for sleep

## 2021-04-02 ENCOUNTER — Other Ambulatory Visit: Payer: Self-pay | Admitting: Family Medicine

## 2021-06-22 ENCOUNTER — Other Ambulatory Visit: Payer: Self-pay | Admitting: Family Medicine

## 2021-06-22 NOTE — Telephone Encounter (Signed)
Last office visit 12/22/2020 for CPE.  Last refilled 12/22/2020 for #30 with 5 refills.  No future appointments.

## 2021-12-24 ENCOUNTER — Other Ambulatory Visit: Payer: Self-pay | Admitting: Family Medicine

## 2021-12-24 NOTE — Telephone Encounter (Signed)
Please schedule CPE with fasting labs prior for Dr. Copland.  

## 2022-01-02 ENCOUNTER — Other Ambulatory Visit (INDEPENDENT_AMBULATORY_CARE_PROVIDER_SITE_OTHER): Payer: Medicare Other

## 2022-01-02 ENCOUNTER — Other Ambulatory Visit (INDEPENDENT_AMBULATORY_CARE_PROVIDER_SITE_OTHER): Payer: Medicare Other | Admitting: Family Medicine

## 2022-01-02 ENCOUNTER — Other Ambulatory Visit: Payer: Self-pay

## 2022-01-02 DIAGNOSIS — E782 Mixed hyperlipidemia: Secondary | ICD-10-CM

## 2022-01-02 DIAGNOSIS — R739 Hyperglycemia, unspecified: Secondary | ICD-10-CM

## 2022-01-02 DIAGNOSIS — Z79899 Other long term (current) drug therapy: Secondary | ICD-10-CM

## 2022-01-02 DIAGNOSIS — Z13220 Encounter for screening for lipoid disorders: Secondary | ICD-10-CM

## 2022-01-02 DIAGNOSIS — Z125 Encounter for screening for malignant neoplasm of prostate: Secondary | ICD-10-CM

## 2022-01-02 DIAGNOSIS — Z131 Encounter for screening for diabetes mellitus: Secondary | ICD-10-CM

## 2022-01-02 LAB — CBC WITH DIFFERENTIAL/PLATELET
Basophils Absolute: 0.1 10*3/uL (ref 0.0–0.1)
Basophils Relative: 0.9 % (ref 0.0–3.0)
Eosinophils Absolute: 0.3 10*3/uL (ref 0.0–0.7)
Eosinophils Relative: 3.8 % (ref 0.0–5.0)
HCT: 51.5 % (ref 39.0–52.0)
Hemoglobin: 17.7 g/dL — ABNORMAL HIGH (ref 13.0–17.0)
Lymphocytes Relative: 30 % (ref 12.0–46.0)
Lymphs Abs: 2.1 10*3/uL (ref 0.7–4.0)
MCHC: 34.4 g/dL (ref 30.0–36.0)
MCV: 96 fl (ref 78.0–100.0)
Monocytes Absolute: 0.5 10*3/uL (ref 0.1–1.0)
Monocytes Relative: 7.8 % (ref 3.0–12.0)
Neutro Abs: 4 10*3/uL (ref 1.4–7.7)
Neutrophils Relative %: 57.5 % (ref 43.0–77.0)
Platelets: 207 10*3/uL (ref 150.0–400.0)
RBC: 5.37 Mil/uL (ref 4.22–5.81)
RDW: 13.4 % (ref 11.5–15.5)
WBC: 6.9 10*3/uL (ref 4.0–10.5)

## 2022-01-02 LAB — LIPID PANEL
Cholesterol: 207 mg/dL — ABNORMAL HIGH (ref 0–200)
HDL: 56.4 mg/dL (ref >39.00)
LDL Cholesterol: 115 mg/dL — ABNORMAL HIGH (ref 0–99)
NonHDL: 150.3
Total CHOL/HDL Ratio: 4
Triglycerides: 178 mg/dL — ABNORMAL HIGH (ref 0.0–149.0)
VLDL: 35.6 mg/dL (ref 0.0–40.0)

## 2022-01-02 LAB — BASIC METABOLIC PANEL
BUN: 14 mg/dL (ref 6–23)
CO2: 32 mEq/L (ref 19–32)
Calcium: 9.6 mg/dL (ref 8.4–10.5)
Chloride: 95 mEq/L — ABNORMAL LOW (ref 96–112)
Creatinine, Ser: 1.06 mg/dL (ref 0.40–1.50)
GFR: 73.02 mL/min (ref >60.00)
Glucose, Bld: 171 mg/dL — ABNORMAL HIGH (ref 70–99)
Potassium: 3.9 mEq/L (ref 3.5–5.1)
Sodium: 138 mEq/L (ref 135–145)

## 2022-01-02 LAB — HEPATIC FUNCTION PANEL
ALT: 86 U/L — ABNORMAL HIGH (ref 0–53)
AST: 88 U/L — ABNORMAL HIGH (ref 0–37)
Albumin: 4.7 g/dL (ref 3.5–5.2)
Alkaline Phosphatase: 85 U/L (ref 39–117)
Bilirubin, Direct: 0.2 mg/dL (ref 0.0–0.3)
Total Bilirubin: 0.9 mg/dL (ref 0.2–1.2)
Total Protein: 7.5 g/dL (ref 6.0–8.3)

## 2022-01-02 LAB — HEMOGLOBIN A1C: Hgb A1c MFr Bld: 7.5 % — ABNORMAL HIGH (ref 4.6–6.5)

## 2022-01-02 LAB — PSA, MEDICARE: PSA: 0.42 ng/ml (ref 0.10–4.00)

## 2022-01-02 NOTE — Progress Notes (Signed)
?  There are no discontinued medications. ?Orders Placed This Encounter  ?Procedures  ? Basic metabolic panel  ? CBC with Differential/Platelet  ? Hepatic function panel  ? Lipid panel  ? Hemoglobin A1c  ? PSA, Medicare  ? ? ?  ?

## 2022-01-08 NOTE — Progress Notes (Signed)
Thurley Francesconi T. Morrigan Wickens, MD, CAQ Sports Medicine [facility] at [facility] [address] [place], [address]  Phone: [phone]  FAX: [phone]  Tyler Ramsey - 66 y.o. male  MRN [id number]  Date of Birth: Dec 30, 1955  Date: 01/09/2022  PCP: Hannah Beat, MD  Referral: Hannah Beat, MD  Chief Complaint  Patient presents with   Medicare Wellness    This visit occurred during the SARS-CoV-2 public health emergency.  Safety protocols were in place, including screening questions prior to the visit, additional usage of staff PPE, and extensive cleaning of exam room while observing appropriate contact time as indicated for disinfecting solutions.   Patient Care Team: Hannah Beat, MD as PCP - General Subjective:   Tyler Ramsey is a 66 y.o. pleasant patient who presents with the following:  Preventative Health Maintenance Visit:  Health Maintenance Summary Reviewed and updated, unless pt declines services.  Tobacco History Reviewed. Alcohol: 1/2 pint of the world Exercise Habits: Some activity, rec at least 30 mins 5 times a week STD concerns: no risk or activity to increase risk Drug Use: None  Colon - will hold for now.   Shingrix # 2 Flu  New onset DM:  A1c is 7.5  Diabetes Mellitus: Tolerating Medications: New onset, not on any medication now.  Previously did have diabetes but this was well controlled after extensive weight loss.  Bariatric surgery. Compliance with diet: fair, Body mass index is 38.09 kg/m. Exercise: minimal / intermittent Avg blood sugars at home: not checking Foot problems: none Hypoglycemia: none No nausea, vomitting, blurred vision, polyuria.  Lab Results  Component Value Date   HGBA1C 7.5 (H) 01/02/2022   HGBA1C 5.8 12/15/2020   HGBA1C 5.7 12/31/2019   Lab Results  Component Value Date   MICROALBUR 1.8 11/18/2014   LDLCALC 115 (H) 01/02/2022   CREATININE 1.06 01/02/2022    Wt Readings  from Last 3 Encounters:  01/09/22 269 lb 4 oz (122.1 kg)  12/22/20 247 lb 8 oz (112.3 kg)  09/27/20 234 lb 8 oz (106.4 kg)     Work is still tough  Lab Results  Component Value Date   ALT 86 (H) 01/02/2022   AST 88 (H) 01/02/2022   ALKPHOS 85 01/02/2022   BILITOT 0.9 01/02/2022    On secondary questioning, he is drinking a lot of alcohol right now, roughly half a pint of liquor each day.  Health Maintenance  Topic Date Due   URINE MICROALBUMIN  11/19/2015   COLONOSCOPY (Pts 45-50yrs Insurance coverage will need to be confirmed)  10/23/2016   FOOT EXAM  12/02/2016   OPHTHALMOLOGY EXAM  04/23/2019   COVID-19 Vaccine (4 - Booster for Pfizer series) 12/12/2020   Zoster Vaccines- Shingrix (2 of 2) 01/14/2021   INFLUENZA VACCINE  05/23/2021   Pneumonia Vaccine 41+ Years old (3) 12/22/2021   HEMOGLOBIN A1C  07/05/2022   TETANUS/TDAP  10/18/2023   Hepatitis C Screening  Completed   HPV VACCINES  Aged Out   Immunization History  Administered Date(s) Administered   Influenza Split 08/12/2012   Influenza-Unspecified 10/23/2016   PFIZER(Purple Top)SARS-COV-2 Vaccination 02/05/2020, 03/09/2020, 10/17/2020   Pneumococcal Conjugate-13 12/22/2020   Pneumococcal Polysaccharide-23 12/01/2015   Tdap 10/17/2013   Zoster Recombinat (Shingrix) 11/19/2020   Zoster, Live 12/01/2015   Patient Active Problem List   Diagnosis Date Noted   Controlled type 2 diabetes mellitus without complication, without long-term current use of insulin (HCC) 10/29/2008    Priority: High  HYPERTENSION 09/24/2008    Priority: High   SLEEP APNEA 09/24/2008    Priority: Medium    Alcohol abuse 01/09/2022   Bilateral carpal tunnel syndrome 10/04/2020   Peripheral neuropathy 10/04/2020   Bariatric surgery status 08/04/2020   Major depressive disorder, recurrent episode, moderate (HCC) 12/03/2015   Hypogonadism in male 11/29/2014   LIVER FUNCTION TESTS, ABNORMAL, HX OF 09/03/2009   ALLERGIC RHINITIS  09/24/2008    Past Medical History:  Diagnosis Date   Allergy    allergic rhinitis   Depression    Diabetes mellitus    Hyperlipidemia    Hypertension    Hypogonadism in male 11/29/2014   Sleep apnea     Past Surgical History:  Procedure Laterality Date   KNEE ARTHROSCOPY W/ ACL RECONSTRUCTION AND HAMSTRING GRAFT  1985   TONSILLECTOMY  1969   TOTAL KNEE ARTHROPLASTY  5/11   Left, Dr Carma Lair    Family History  Problem Relation Age of Onset   Emphysema Father    Heart disease Mother     Past Medical History, Surgical History, Social History, Family History, Problem List, Medications, and Allergies have been reviewed and updated if relevant.  Review of Systems: Pertinent positives are listed above.  Otherwise, a full 14 point review of systems has been done in full and it is negative except where it is noted positive.  Objective:   BP 130/70   Pulse 60   Temp 97.9 F (36.6 C) (Oral)   Ht 5' 10.5" (1.791 m)   Wt 269 lb 4 oz (122.1 kg)   SpO2 98%   BMI 38.09 kg/m  Ideal Body Weight: Weight in (lb) to have BMI = 25: 176.4  Ideal Body Weight: Weight in (lb) to have BMI = 25: 176.4 Hearing Screening   250Hz  500Hz  1000Hz  2000Hz  4000Hz   Right ear 20 20 20 20 20   Left ear 20 20 20 20 20   Vision Screening - Comments:: Last eye exam at [facility] [address] in January 2023  Depression screen [facility] 2/9 12/22/2020 12/30/2018 12/24/2017  Decreased Interest 0 0 0  Down, Depressed, Hopeless 0 0 0  PHQ - 2 Score 0 0 0     GEN: well developed, well nourished, no acute distress Eyes: conjunctiva and lids normal, PERRLA, EOMI ENT: TM clear, nares clear, oral exam WNL Neck: supple, no lymphadenopathy, no thyromegaly, no JVD Pulm: clear to auscultation and percussion, respiratory effort normal CV: regular rate and rhythm, S1-S2, no murmur, rub or gallop, no bruits, peripheral pulses normal and symmetric, no cyanosis, clubbing, edema or varicosities GI: soft, non-tender; no  hepatosplenomegaly, masses; active bowel sounds all quadrants GU: deferred Lymph: no cervical, axillary or inguinal adenopathy MSK: gait normal, muscle tone and strength WNL, no joint swelling, effusions, discoloration, crepitus  SKIN: clear, good turgor, color WNL, no rashes, lesions, or ulcerations Neuro: normal mental status, normal strength, sensation, and motion Psych: alert; oriented to person, place and time, normally interactive and not anxious or depressed in appearance.  All labs reviewed with patient. Results for orders placed or performed in visit on 01/02/22  Basic metabolic panel  Result Value Ref Range   Sodium 138 135 - 145 mEq/L   Potassium 3.9 3.5 - 5.1 mEq/L   Chloride 95 (L) 96 - 112 mEq/L   CO2 32 19 - 32 mEq/L   Glucose, Bld 171 (H) 70 - 99 mg/dL   BUN 14 6 - 23 mg/dL   Creatinine, Ser 1.61 0.40 - 1.50 mg/dL  GFR 73.02 >60.00 mL/min   Calcium 9.6 8.4 - 10.5 mg/dL  CBC with Differential/Platelet  Result Value Ref Range   WBC 6.9 4.0 - 10.5 K/uL   RBC 5.37 4.22 - 5.81 Mil/uL   Hemoglobin 17.7 (H) 13.0 - 17.0 g/dL   HCT 16.1 09.6 - 04.5 %   MCV 96.0 78.0 - 100.0 fl   MCHC 34.4 30.0 - 36.0 g/dL   RDW 40.9 81.1 - 91.4 %   Platelets 207.0 150.0 - 400.0 K/uL   Neutrophils Relative % 57.5 43.0 - 77.0 %   Lymphocytes Relative 30.0 12.0 - 46.0 %   Monocytes Relative 7.8 3.0 - 12.0 %   Eosinophils Relative 3.8 0.0 - 5.0 %   Basophils Relative 0.9 0.0 - 3.0 %   Neutro Abs 4.0 1.4 - 7.7 K/uL   Lymphs Abs 2.1 0.7 - 4.0 K/uL   Monocytes Absolute 0.5 0.1 - 1.0 K/uL   Eosinophils Absolute 0.3 0.0 - 0.7 K/uL   Basophils Absolute 0.1 0.0 - 0.1 K/uL  Hepatic function panel  Result Value Ref Range   Total Bilirubin 0.9 0.2 - 1.2 mg/dL   Bilirubin, Direct 0.2 0.0 - 0.3 mg/dL   Alkaline Phosphatase 85 39 - 117 U/L   AST 88 (H) 0 - 37 U/L   ALT 86 (H) 0 - 53 U/L   Total Protein 7.5 6.0 - 8.3 g/dL   Albumin 4.7 3.5 - 5.2 g/dL  Lipid panel  Result Value Ref Range    Cholesterol 207 (H) 0 - 200 mg/dL   Triglycerides 782.9 (H) 0.0 - 149.0 mg/dL   HDL 56.21 >30.86 mg/dL   VLDL 57.8 0.0 - 46.9 mg/dL   LDL Cholesterol 629 (H) 0 - 99 mg/dL   Total CHOL/HDL Ratio 4    NonHDL 150.30   Hemoglobin A1c  Result Value Ref Range   Hgb A1c MFr Bld 7.5 (H) 4.6 - 6.5 %  PSA, Medicare  Result Value Ref Range   PSA 0.42 0.10 - 4.00 ng/ml    Assessment and Plan:     ICD-10-CM   1. Healthcare maintenance  Z00.00     2. Controlled type 2 diabetes mellitus without complication, without long-term current use of insulin (HCC)  E11.9     3. Alcohol abuse  F10.10     4. Elevated LFTs  R79.89      Number of concerning things.  Diabetes has resurfaced after some dormant study after bariatric surgery.  A1c is at 7.5.  He continues to gain weight.  Lipids have worsened.  He is drinking excessively, half a pint of liquor a day, now his LFTs are elevated.  He does note that this has been a problem intermittently off and on for many years.  I do think this is likely very much contributing to weight gain and worsening of his diabetes.  He is going to follow-up in 3 months to recheck all of these things.  Health Maintenance Exam: The patient's preventative maintenance and recommended screening tests for an annual wellness exam were reviewed in full today. Brought up to date unless services declined.  Counselled on the importance of diet, exercise, and its role in overall health and mortality. The patient's FH and SH was reviewed, including their home life, tobacco status, and drug and alcohol status.  Follow-up in 1 year for physical exam or additional follow-up below.  Follow-up: Return in about 3 months (around 04/11/2022) for diabetes, cholesterol, liver. Or follow-up in 1 year if  not noted.  Meds ordered this encounter  Medications   zolpidem (AMBIEN) 10 MG tablet    Sig: Take 1 tablet (10 mg total) by mouth at bedtime as needed. for sleep    Dispense:  30  tablet    Refill:  5   triamcinolone ointment (KENALOG) 0.1 %    Sig: Apply 1 application. topically 2 (two) times daily.    Dispense:  30 g    Refill:  1   Medications Discontinued During This Encounter  Medication Reason   zolpidem (AMBIEN) 10 MG tablet Reorder   No orders of the defined types were placed in this encounter.   Signed,  Elpidio Galea. Finlee Milo, MD   Allergies as of 01/09/2022       Reactions   Sulfonamide Derivatives    Morphine And Related Rash        Medication List        Accurate as of January 09, 2022  1:56 PM. If you have any questions, ask your nurse or doctor.          aspirin 81 MG tablet Take 81 mg by mouth daily.   citalopram 40 MG tablet Commonly known as: CELEXA Take 1 tablet by mouth once daily   DULoxetine 60 MG capsule Commonly known as: CYMBALTA Take 1 capsule by mouth once daily   hydrochlorothiazide 12.5 MG tablet Commonly known as: HYDRODIURIL Take 1 tablet by mouth once daily   NEEDLE (DISP) 25 G 25G X 1-1/2" Misc Use as directed   simvastatin 10 MG tablet Commonly known as: ZOCOR Take 1 tablet by mouth once daily   Syringe (Disposable) 3 ML Misc Use as directed   triamcinolone ointment 0.1 % Commonly known as: KENALOG Apply 1 application. topically 2 (two) times daily. Started by: Hannah Beat, MD   zolpidem 10 MG tablet Commonly known as: AMBIEN Take 1 tablet (10 mg total) by mouth at bedtime as needed. for sleep

## 2022-01-09 ENCOUNTER — Other Ambulatory Visit: Payer: Self-pay

## 2022-01-09 ENCOUNTER — Encounter: Payer: Self-pay | Admitting: Family Medicine

## 2022-01-09 ENCOUNTER — Ambulatory Visit (INDEPENDENT_AMBULATORY_CARE_PROVIDER_SITE_OTHER): Payer: Medicare Other | Admitting: Family Medicine

## 2022-01-09 VITALS — BP 130/70 | HR 60 | Temp 97.9°F | Ht 70.5 in | Wt 269.2 lb

## 2022-01-09 DIAGNOSIS — Z0000 Encounter for general adult medical examination without abnormal findings: Secondary | ICD-10-CM

## 2022-01-09 DIAGNOSIS — Z0001 Encounter for general adult medical examination with abnormal findings: Secondary | ICD-10-CM

## 2022-01-09 DIAGNOSIS — R7989 Other specified abnormal findings of blood chemistry: Secondary | ICD-10-CM | POA: Diagnosis not present

## 2022-01-09 DIAGNOSIS — E119 Type 2 diabetes mellitus without complications: Secondary | ICD-10-CM | POA: Diagnosis not present

## 2022-01-09 DIAGNOSIS — F1010 Alcohol abuse, uncomplicated: Secondary | ICD-10-CM | POA: Insufficient documentation

## 2022-01-09 MED ORDER — ZOLPIDEM TARTRATE 10 MG PO TABS: 10.0000 mg | ORAL_TABLET | Freq: Every evening | ORAL | 5 refills | Status: DC | PRN

## 2022-01-09 MED ORDER — TRIAMCINOLONE ACETONIDE 0.1 % EX OINT: 1.0000 "application " | TOPICAL_OINTMENT | Freq: Two times a day (BID) | CUTANEOUS | 1 refills | Status: DC

## 2022-01-09 NOTE — Patient Instructions (Addendum)
Shingrix vaccine #2 ? ? ?Either Uric acid or Salicyclic acid lotion ?

## 2022-01-10 ENCOUNTER — Telehealth: Payer: Self-pay | Admitting: Family Medicine

## 2022-01-10 NOTE — Telephone Encounter (Signed)
Spoke to patient by telephone and was advised that he picked up a 90 day supply of his medications from [facility] on 12/24/21.  Patient is at [facility] and he is working with them to set up a profile of his medications so that they will request his refills when they are needed next. Patient is okay with his medications at this point. Patient was advised that he or [facility] should request refills prior to him needing them the next time and he verbalized understanding. ?

## 2022-01-10 NOTE — Telephone Encounter (Signed)
Pt called in stated he would like all his RX to be sent in to [id number] [address], [place] - [address]  . Because of change of insurance  ?

## 2022-01-11 NOTE — Telephone Encounter (Signed)
error 

## 2022-01-19 NOTE — Telephone Encounter (Signed)
Looks like patient has appt already scheduled for 04/17/22 ?

## 2022-03-26 ENCOUNTER — Other Ambulatory Visit: Payer: Self-pay | Admitting: Family Medicine

## 2022-04-17 ENCOUNTER — Ambulatory Visit: Payer: Medicare Other | Admitting: Family Medicine

## 2022-07-17 ENCOUNTER — Ambulatory Visit: Payer: Medicare Other | Admitting: Family Medicine

## 2022-09-21 ENCOUNTER — Other Ambulatory Visit: Payer: Self-pay | Admitting: Family Medicine

## 2022-10-09 ENCOUNTER — Ambulatory Visit: Payer: Medicare Other | Admitting: Podiatry

## 2022-10-09 ENCOUNTER — Encounter: Payer: Self-pay | Admitting: Podiatry

## 2022-10-09 ENCOUNTER — Ambulatory Visit (INDEPENDENT_AMBULATORY_CARE_PROVIDER_SITE_OTHER): Payer: Medicare Other

## 2022-10-09 VITALS — BP 150/83 | HR 62

## 2022-10-09 DIAGNOSIS — M2041 Other hammer toe(s) (acquired), right foot: Secondary | ICD-10-CM | POA: Diagnosis not present

## 2022-10-09 DIAGNOSIS — M1A0720 Idiopathic chronic gout, left ankle and foot, without tophus (tophi): Secondary | ICD-10-CM | POA: Diagnosis not present

## 2022-10-09 DIAGNOSIS — M7752 Other enthesopathy of left foot: Secondary | ICD-10-CM

## 2022-10-09 LAB — HM DIABETES EYE EXAM

## 2022-10-09 MED ORDER — TRIAMCINOLONE ACETONIDE 40 MG/ML IJ SUSP
20.0000 mg | Freq: Once | INTRAMUSCULAR | Status: AC
  Administered 2022-10-09: 20 mg

## 2022-10-09 NOTE — Progress Notes (Signed)
Subjective:  Patient ID: CYPRESS FANFAN, male    DOB: 02-06-1956,  MRN: [id number] HPI Chief Complaint  Patient presents with   Toe Pain    5th toe right - very swollen and some redness, tender, injured in 1985 and has flared like this intermittent over the years, Dr. Logan Bores has checked it before, had an injection, never has completely gone down, trouble with shoes   New Patient (Initial Visit)    Est pt 04/2019    66 y.o. male presents with the above complaint.   ROS: Denies fever chills nausea vomit muscle aches pains calf pain back pain chest pain shortness of breath.  Past Medical History:  Diagnosis Date   Allergy    allergic rhinitis   Depression    Diabetes mellitus    Hyperlipidemia    Hypertension    Hypogonadism in male 11/29/2014   Sleep apnea    Past Surgical History:  Procedure Laterality Date   KNEE ARTHROSCOPY W/ ACL RECONSTRUCTION AND HAMSTRING GRAFT  1985   TONSILLECTOMY  1969   TOTAL KNEE ARTHROPLASTY  5/11   Left, Dr Carma Lair    Current Outpatient Medications:    aspirin 81 MG tablet, Take 81 mg by mouth daily., Disp: , Rfl:    citalopram (CELEXA) 40 MG tablet, TAKE 1 TABLET BY MOUTH ONCE DAILY, Disp: 90 tablet, Rfl: 0   DULoxetine (CYMBALTA) 60 MG capsule, TAKE ONE CAPSULE BY MOUTH ONCE DAILY, Disp: 90 capsule, Rfl: 0   hydrochlorothiazide (HYDRODIURIL) 12.5 MG tablet, TAKE 1 TABLET BY MOUTH ONCE DAILY, Disp: 90 tablet, Rfl: 0   simvastatin (ZOCOR) 10 MG tablet, TAKE 1 TABLET BY MOUTH ONCE DAILY., Disp: 90 tablet, Rfl: 0   triamcinolone ointment (KENALOG) 0.1 %, Apply 1 application. topically 2 (two) times daily., Disp: 30 g, Rfl: 1   zolpidem (AMBIEN) 10 MG tablet, Take 1 tablet (10 mg total) by mouth at bedtime as needed. for sleep, Disp: 30 tablet, Rfl: 5  Allergies  Allergen Reactions   Sulfonamide Derivatives    Morphine And Related Rash   Review of Systems Objective:   Vitals:   10/09/22 1033  BP: (!) 150/83  Pulse: 62    General:  Well developed, nourished, in no acute distress, alert and oriented x3   Dermatological: Skin is warm, dry and supple bilateral. Nails x 10 are well maintained; remaining integument appears unremarkable at this time. There are no open sores, no preulcerative lesions, no rash or signs of infection present.  Vascular: Dorsalis Pedis artery and Posterior Tibial artery pedal pulses are 2/4 bilateral with immedate capillary fill time. Pedal hair growth present. No varicosities and no lower extremity edema present bilateral.   Neruologic: Grossly intact via light touch bilateral. Vibratory intact via tuning fork bilateral. Protective threshold with Semmes Wienstein monofilament intact to all pedal sites bilateral. Patellar and Achilles deep tendon reflexes 2+ bilateral. No Babinski or clonus noted bilateral.   Musculoskeletal: No gross boney pedal deformities bilateral. No pain, crepitus, or limitation noted with foot and ankle range of motion bilateral. Muscular strength 5/5 in all groups tested bilateral.  Large bulbous nonpulsatile nodule no open wound fifth toe left.  His fifth toe is approximately the size of his great toe.  Gait: Unassisted, Nonantalgic.    Radiographs:  Radiographs taken today demonstrate osseously mature individual with large soft tissue nodule and swelling overlying the fifth digit.  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  Appears to have caused near complete breakdown of the middle phalanx.  Does not particularly demonstrate as infection.  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  Chronic gout left foot fifth toe  Plan: I injected the area today with Kenalog to help decrease the inflammation and the soreness in the toe itself.  However I do believe this is going to need to be debulk in a future surgery.  I also think that he could try Krystexxa and  we will provide the rep with his information.  We can see if he is a candidate for this.     Jayelle Page T. [place], [place]

## 2022-12-04 ENCOUNTER — Telehealth: Payer: Self-pay | Admitting: Family Medicine

## 2022-12-04 MED ORDER — ZOLPIDEM TARTRATE 10 MG PO TABS: 10.0000 mg | ORAL_TABLET | Freq: Every evening | ORAL | 5 refills | Status: DC | PRN

## 2022-12-04 MED ORDER — DULOXETINE HCL 60 MG PO CPEP: 60.0000 mg | ORAL_CAPSULE | Freq: Every day | ORAL | 0 refills | Status: DC

## 2022-12-04 MED ORDER — SIMVASTATIN 10 MG PO TABS: 10.0000 mg | ORAL_TABLET | Freq: Every day | ORAL | 0 refills | Status: DC

## 2022-12-04 MED ORDER — HYDROCHLOROTHIAZIDE 12.5 MG PO TABS: 12.5000 mg | ORAL_TABLET | Freq: Every day | ORAL | 0 refills | Status: DC

## 2022-12-04 MED ORDER — CITALOPRAM HYDROBROMIDE 40 MG PO TABS: 40.0000 mg | ORAL_TABLET | Freq: Every day | ORAL | 0 refills | Status: DC

## 2022-12-04 NOTE — Telephone Encounter (Signed)
Done  Medication Management during today's office visit: Meds ordered this encounter  Medications   citalopram (CELEXA) 40 MG tablet    Sig: Take 1 tablet (40 mg total) by mouth daily.    Dispense:  90 tablet    Refill:  0   DULoxetine (CYMBALTA) 60 MG capsule    Sig: Take 1 capsule (60 mg total) by mouth daily.    Dispense:  90 capsule    Refill:  0   hydrochlorothiazide (HYDRODIURIL) 12.5 MG tablet    Sig: Take 1 tablet (12.5 mg total) by mouth daily.    Dispense:  90 tablet    Refill:  0   simvastatin (ZOCOR) 10 MG tablet    Sig: Take 1 tablet (10 mg total) by mouth daily.    Dispense:  90 tablet    Refill:  0   zolpidem (AMBIEN) 10 MG tablet    Sig: Take 1 tablet (10 mg total) by mouth at bedtime as needed. for sleep    Dispense:  30 tablet    Refill:  5   Tyresse Jayson T. Stefan Karen, MD

## 2022-12-04 NOTE — Telephone Encounter (Signed)
Last office visit 01/09/2022 for [place].  Last refilled 01/12/2022 for #30 with 5 refills.  Next Appt: 01/24/2023 for CPE.

## 2022-12-04 NOTE — Telephone Encounter (Signed)
Patient called in and stated that his health insurance changed and he will need all his medication to be sent over to [id number] [place], [place]. Thank you!

## 2022-12-20 ENCOUNTER — Other Ambulatory Visit: Payer: Self-pay | Admitting: Family Medicine

## 2023-01-04 ENCOUNTER — Other Ambulatory Visit: Payer: Medicare Other

## 2023-01-07 ENCOUNTER — Other Ambulatory Visit: Payer: Self-pay | Admitting: Family Medicine

## 2023-01-07 DIAGNOSIS — Z125 Encounter for screening for malignant neoplasm of prostate: Secondary | ICD-10-CM

## 2023-01-07 DIAGNOSIS — E119 Type 2 diabetes mellitus without complications: Secondary | ICD-10-CM

## 2023-01-07 DIAGNOSIS — R7989 Other specified abnormal findings of blood chemistry: Secondary | ICD-10-CM

## 2023-01-07 DIAGNOSIS — E782 Mixed hyperlipidemia: Secondary | ICD-10-CM

## 2023-01-07 DIAGNOSIS — Z79899 Other long term (current) drug therapy: Secondary | ICD-10-CM

## 2023-01-11 ENCOUNTER — Encounter: Payer: Medicare Other | Admitting: Family Medicine

## 2023-01-18 ENCOUNTER — Other Ambulatory Visit (INDEPENDENT_AMBULATORY_CARE_PROVIDER_SITE_OTHER): Payer: HMO

## 2023-01-18 ENCOUNTER — Telehealth: Payer: Self-pay

## 2023-01-18 DIAGNOSIS — Z125 Encounter for screening for malignant neoplasm of prostate: Secondary | ICD-10-CM | POA: Diagnosis not present

## 2023-01-18 DIAGNOSIS — Z79899 Other long term (current) drug therapy: Secondary | ICD-10-CM | POA: Diagnosis not present

## 2023-01-18 DIAGNOSIS — E119 Type 2 diabetes mellitus without complications: Secondary | ICD-10-CM | POA: Diagnosis not present

## 2023-01-18 DIAGNOSIS — E782 Mixed hyperlipidemia: Secondary | ICD-10-CM | POA: Diagnosis not present

## 2023-01-18 DIAGNOSIS — R7989 Other specified abnormal findings of blood chemistry: Secondary | ICD-10-CM | POA: Diagnosis not present

## 2023-01-18 LAB — HEPATIC FUNCTION PANEL
ALT: 86 U/L — ABNORMAL HIGH (ref 0–53)
AST: 123 U/L — ABNORMAL HIGH (ref 0–37)
Albumin: 4.6 g/dL (ref 3.5–5.2)
Alkaline Phosphatase: 96 U/L (ref 39–117)
Bilirubin, Direct: 0.3 mg/dL (ref 0.0–0.3)
Total Bilirubin: 1.3 mg/dL — ABNORMAL HIGH (ref 0.2–1.2)
Total Protein: 8 g/dL (ref 6.0–8.3)

## 2023-01-18 LAB — CBC WITH DIFFERENTIAL/PLATELET
Basophils Absolute: 0.1 10*3/uL (ref 0.0–0.1)
Basophils Relative: 0.8 % (ref 0.0–3.0)
Eosinophils Absolute: 0.3 10*3/uL (ref 0.0–0.7)
Eosinophils Relative: 3.6 % (ref 0.0–5.0)
HCT: 54.3 % — ABNORMAL HIGH (ref 39.0–52.0)
Hemoglobin: 19.2 g/dL (ref 13.0–17.0)
Lymphocytes Relative: 35 % (ref 12.0–46.0)
Lymphs Abs: 2.7 10*3/uL (ref 0.7–4.0)
MCHC: 35.4 g/dL (ref 30.0–36.0)
MCV: 95.5 fl (ref 78.0–100.0)
Monocytes Absolute: 0.7 10*3/uL (ref 0.1–1.0)
Monocytes Relative: 8.6 % (ref 3.0–12.0)
Neutro Abs: 4 10*3/uL (ref 1.4–7.7)
Neutrophils Relative %: 52 % (ref 43.0–77.0)
Platelets: 247 10*3/uL (ref 150.0–400.0)
RBC: 5.68 Mil/uL (ref 4.22–5.81)
RDW: 13.4 % (ref 11.5–15.5)
WBC: 7.6 10*3/uL (ref 4.0–10.5)

## 2023-01-18 LAB — MICROALBUMIN / CREATININE URINE RATIO
Creatinine,U: 88.4 mg/dL
Microalb Creat Ratio: 6.5 mg/g (ref 0.0–30.0)
Microalb, Ur: 5.7 mg/dL — ABNORMAL HIGH (ref 0.0–1.9)

## 2023-01-18 LAB — BASIC METABOLIC PANEL
BUN: 17 mg/dL (ref 6–23)
CO2: 27 mEq/L (ref 19–32)
Calcium: 10.1 mg/dL (ref 8.4–10.5)
Chloride: 91 mEq/L — ABNORMAL LOW (ref 96–112)
Creatinine, Ser: 1.4 mg/dL (ref 0.40–1.50)
GFR: 51.91 mL/min — ABNORMAL LOW (ref >60.00)
Glucose, Bld: 238 mg/dL — ABNORMAL HIGH (ref 70–99)
Potassium: 3.4 mEq/L — ABNORMAL LOW (ref 3.5–5.1)
Sodium: 135 mEq/L (ref 135–145)

## 2023-01-18 LAB — LIPID PANEL
Cholesterol: 213 mg/dL — ABNORMAL HIGH (ref 0–200)
HDL: 52.9 mg/dL (ref >39.00)
LDL Cholesterol: 129 mg/dL — ABNORMAL HIGH (ref 0–99)
NonHDL: 160.17
Total CHOL/HDL Ratio: 4
Triglycerides: 158 mg/dL — ABNORMAL HIGH (ref 0.0–149.0)
VLDL: 31.6 mg/dL (ref 0.0–40.0)

## 2023-01-18 LAB — PSA, MEDICARE: PSA: 0.81 ng/ml (ref 0.10–4.00)

## 2023-01-18 LAB — HEMOGLOBIN A1C: Hgb A1c MFr Bld: 8.2 % — ABNORMAL HIGH (ref 4.6–6.5)

## 2023-01-18 NOTE — Telephone Encounter (Signed)
[id number] with [facility] called critical lab report elevated hgb 19.2. sending note to Dr Lorelei Pont who is out of office; Dr Diona Browner who is in office and [facility] pool. Will log in lab notebook. Pt is scheduled for CPX with Dr Lorelei Pont on 01/24/23.

## 2023-01-18 NOTE — Telephone Encounter (Signed)
I think that is fine.  He was on testosterone in the past when it was elevated, but I will talk to him next week at his physical about this.

## 2023-01-18 NOTE — Telephone Encounter (Addendum)
Tried to reach Tyler Ramsey.  His voicemail is not set up so I was unable to leave a message.  I have sent patient this information as well on his MyChart.

## 2023-01-18 NOTE — Telephone Encounter (Signed)
Appears to be a chronic intermittent issue for patient.  Hemoglobin elevated last year as well in addition to 6 and 7 years ago.  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  Please call patient and if he is taking testosterone consider holding until he can follow-up with his PCP next

## 2023-01-19 ENCOUNTER — Other Ambulatory Visit: Payer: Self-pay

## 2023-01-23 NOTE — Progress Notes (Unsigned)
Kyesha Balla T. Lunabelle Oatley, MD, [place] at [facility] [place], [address]  Phone: [phone]  FAX: [place] - 67 y.o. male  MRN [id number]  Date of Birth: 12/05/1955  Date: 01/24/2023  PCP: Owens Loffler, MD  Referral: Owens Loffler, MD  No chief complaint on file.  Patient Care Team: Owens Loffler, MD as PCP - General Subjective:   Tyler Ramsey is a 67 y.o. pleasant patient who presents for a medicare wellness examination:  Preventative Health Maintenance Visit:  Health Maintenance Summary Reviewed and updated, unless pt declines services.  Tobacco History Reviewed. Alcohol: No concerns, no excessive use Exercise Habits: Some activity, rec at least 30 mins 5 times a week STD concerns: no risk or activity to increase risk Drug Use: None  Colon Feet Eye exam Shingrix #2 needed  Diabetes Mellitus: Tolerating Medications: yes Compliance with diet: fair, There is no height or weight on file to calculate BMI. Exercise: minimal / intermittent Avg blood sugars at home: not checking Foot problems: none Hypoglycemia: none No nausea, vomitting, blurred vision, polyuria.  Lab Results  Component Value Date   HGBA1C 8.2 (H) 01/18/2023   HGBA1C 7.5 (H) 01/02/2022   HGBA1C 5.8 12/15/2020   Lab Results  Component Value Date   MICROALBUR 5.7 (H) 01/18/2023   LDLCALC 129 (H) 01/18/2023   CREATININE 1.40 01/18/2023    Wt Readings from Last 3 Encounters:  01/09/22 269 lb 4 oz (122.1 kg)  12/22/20 247 lb 8 oz (112.3 kg)  09/27/20 234 lb 8 oz (106.4 kg)    HTN: Tolerating all medications without side effects Stable and at goal No CP, no sob. No HA.  BP Readings from Last 3 Encounters:  10/09/22 (!) 150/83  01/09/22 130/70  12/22/20 [id number]    Basic Metabolic Panel:    Component Value Date/Time   NA 135 01/18/2023 0839   NA 139 08/14/2012 1044   K 3.4 (L) 01/18/2023 0839    K 4.2 08/14/2012 1044   CL 91 (L) 01/18/2023 0839   CL 102 08/14/2012 1044   CO2 27 01/18/2023 0839   CO2 29 08/14/2012 1044   BUN 17 01/18/2023 0839   BUN 15 08/14/2012 1044   CREATININE 1.40 01/18/2023 0839   CREATININE 0.83 08/14/2012 1044   GLUCOSE 238 (H) 01/18/2023 0839   GLUCOSE 79 08/14/2012 1044   CALCIUM 10.1 01/18/2023 0839   CALCIUM 9.1 08/14/2012 1044    Presents with acutely elevated hemoglobin and hematocrit.  Hemoglobin is 19.2.  He has had some elevated hemoglobins in the past distantly when he was on testosterone.  His LFTs are also elevated. Lab Results  Component Value Date   ALT 86 (H) 01/18/2023   AST 123 (H) 01/18/2023   ALKPHOS 96 01/18/2023   BILITOT 1.3 (H) 01/18/2023     Health Maintenance  Topic Date Due   COLONOSCOPY (Pts 45-1yrs Insurance coverage will need to be confirmed)  10/23/2016   FOOT EXAM  12/02/2016   OPHTHALMOLOGY EXAM  04/23/2019   Zoster Vaccines- Shingrix (2 of 2) 01/14/2021   COVID-19 Vaccine (4 - 2023-24 season) 06/23/2022   Medicare Annual Wellness (AWV)  01/10/2023   INFLUENZA VACCINE  05/24/2023   HEMOGLOBIN A1C  07/21/2023   DTaP/Tdap/Td (2 - Td or Tdap) 10/18/2023   Diabetic kidney evaluation - eGFR measurement  01/18/2024   Diabetic kidney evaluation - Urine ACR  01/18/2024   Pneumonia Vaccine 65+ Years  old (3 of 3 - PPSV23 or PCV20) 12/22/2025   Hepatitis C Screening  Completed   HPV VACCINES  Aged Out    Immunization History  Administered Date(s) Administered   Influenza Split 08/12/2012   Influenza-Unspecified 10/23/2016   PFIZER(Purple Top)SARS-COV-2 Vaccination 02/05/2020, 03/09/2020, 10/17/2020   Pneumococcal Conjugate-13 12/22/2020   Pneumococcal Polysaccharide-23 12/01/2015   Tdap 10/17/2013   Zoster Recombinat (Shingrix) 11/19/2020   Zoster, Live 12/01/2015    Patient Active Problem List   Diagnosis Date Noted   Controlled type 2 diabetes mellitus without complication, without long-term current  use of insulin 10/29/2008    Priority: High   HYPERTENSION 09/24/2008    Priority: High   SLEEP APNEA 09/24/2008    Priority: Medium    Alcohol abuse 01/09/2022   Bilateral carpal tunnel syndrome 10/04/2020   Peripheral neuropathy 10/04/2020   Bariatric surgery status 08/04/2020   Major depressive disorder, recurrent episode, moderate 12/03/2015   Hypogonadism in male 11/29/2014   LIVER FUNCTION TESTS, ABNORMAL, HX OF 09/03/2009   ALLERGIC RHINITIS 09/24/2008    Past Medical History:  Diagnosis Date   Allergy    allergic rhinitis   Depression    Diabetes mellitus    Hyperlipidemia    Hypertension    Hypogonadism in male 11/29/2014   Sleep apnea     Past Surgical History:  Procedure Laterality Date   KNEE ARTHROSCOPY W/ ACL RECONSTRUCTION AND HAMSTRING GRAFT  1985   TONSILLECTOMY  1969   TOTAL KNEE ARTHROPLASTY  5/11   Left, Dr Coralee North    Family History  Problem Relation Age of Onset   Emphysema Father    Heart disease Mother     Social History   Social History Narrative   Lived in [place], moved to [place] and plans to settle in [place].   Lives with significant other.   Right-handed.   No daily use of caffeine.    Past Medical History, Surgical History, Social History, Family History, Problem List, Medications, and Allergies have been reviewed and updated if relevant.  Review of Systems: Pertinent positives are listed above.  Otherwise, a full 14 point review of systems has been done in full and it is negative except where it is noted positive.  Objective:   There were no vitals taken for this visit.    01/09/2022   11:47 AM  Fall Risk  Falls in the past year? 0  (RETIRED) Patient Fall Risk Level Low fall risk   Ideal Body Weight:   No results found.    12/22/2020    9:08 AM 12/30/2018    8:31 AM 12/24/2017    8:31 AM  Depression screen PHQ 2/9  Decreased Interest 0 0 0  Down, Depressed, Hopeless 0 0 0  PHQ - 2 Score 0 0 0     GEN:  well developed, well nourished, no acute distress Eyes: conjunctiva and lids normal, PERRLA, EOMI ENT: TM clear, nares clear, oral exam WNL Neck: supple, no lymphadenopathy, no thyromegaly, no JVD Pulm: clear to auscultation and percussion, respiratory effort normal CV: regular rate and rhythm, S1-S2, no murmur, rub or gallop, no bruits, peripheral pulses normal and symmetric, no cyanosis, clubbing, edema or varicosities GI: soft, non-tender; no hepatosplenomegaly, masses; active bowel sounds all quadrants GU: deferred Lymph: no cervical, axillary or inguinal adenopathy MSK: gait normal, muscle tone and strength WNL, no joint swelling, effusions, discoloration, crepitus  SKIN: clear, good turgor, color WNL, no rashes, lesions, or ulcerations Neuro: normal mental status, normal  strength, sensation, and motion Psych: alert; oriented to person, place and time, normally interactive and not anxious or depressed in appearance.  All labs reviewed with patient.  Results for orders placed or performed in visit on 01/18/23  PSA, Medicare  Result Value Ref Range   PSA 0.81 0.10 - 4.00 ng/ml  Microalbumin / creatinine urine ratio  Result Value Ref Range   Microalb, Ur 5.7 (H) 0.0 - 1.9 mg/dL   Creatinine,U 88.4 mg/dL   Microalb Creat Ratio 6.5 0.0 - 30.0 mg/g  Lipid panel  Result Value Ref Range   Cholesterol 213 (H) 0 - 200 mg/dL   Triglycerides 158.0 (H) 0.0 - 149.0 mg/dL   HDL 52.90 >39.00 mg/dL   VLDL 31.6 0.0 - 40.0 mg/dL   LDL Cholesterol 129 (H) 0 - 99 mg/dL   Total CHOL/HDL Ratio 4    NonHDL 160.17   CBC with Differential/Platelet  Result Value Ref Range   WBC 7.6 4.0 - 10.5 K/uL   RBC 5.68 4.22 - 5.81 Mil/uL   Hemoglobin 19.2 Repeated and verified X2. (HH) 13.0 - 17.0 g/dL   HCT 54.3 Repeated and verified X2. (H) 39.0 - 52.0 %   MCV 95.5 78.0 - 100.0 fl   MCHC 35.4 30.0 - 36.0 g/dL   RDW 13.4 11.5 - 15.5 %   Platelets 247.0 150.0 - 400.0 K/uL   Neutrophils Relative % 52.0  43.0 - 77.0 %   Lymphocytes Relative 35.0 12.0 - 46.0 %   Monocytes Relative 8.6 3.0 - 12.0 %   Eosinophils Relative 3.6 0.0 - 5.0 %   Basophils Relative 0.8 0.0 - 3.0 %   Neutro Abs 4.0 1.4 - 7.7 K/uL   Lymphs Abs 2.7 0.7 - 4.0 K/uL   Monocytes Absolute 0.7 0.1 - 1.0 K/uL   Eosinophils Absolute 0.3 0.0 - 0.7 K/uL   Basophils Absolute 0.1 0.0 - 0.1 K/uL  Hemoglobin A1c  Result Value Ref Range   Hgb A1c MFr Bld 8.2 (H) 4.6 - 6.5 %  Hepatic function panel  Result Value Ref Range   Total Bilirubin 1.3 (H) 0.2 - 1.2 mg/dL   Bilirubin, Direct 0.3 0.0 - 0.3 mg/dL   Alkaline Phosphatase 96 39 - 117 U/L   AST 123 (H) 0 - 37 U/L   ALT 86 (H) 0 - 53 U/L   Total Protein 8.0 6.0 - 8.3 g/dL   Albumin 4.6 3.5 - 5.2 g/dL  Basic metabolic panel  Result Value Ref Range   Sodium 135 135 - 145 mEq/L   Potassium 3.4 (L) 3.5 - 5.1 mEq/L   Chloride 91 (L) 96 - 112 mEq/L   CO2 27 19 - 32 mEq/L   Glucose, Bld 238 (H) 70 - 99 mg/dL   BUN 17 6 - 23 mg/dL   Creatinine, Ser 1.40 0.40 - 1.50 mg/dL   GFR 51.91 (L) >60.00 mL/min   Calcium 10.1 8.4 - 10.5 mg/dL    Assessment and Plan:     ICD-10-CM   1. Encounter for health maintenance examination with abnormal findings  Z00.01       Health Maintenance Exam: The patient's preventative maintenance and recommended screening tests for an annual wellness exam were reviewed in full today. Brought up to date unless services declined.  Counselled on the importance of diet, exercise, and its role in overall health and mortality. The patient's FH and SH was reviewed, including their home life, tobacco status, and drug and alcohol status.  Follow-up in 1  year for physical exam or additional follow-up below.  I have personally reviewed the Medicare Annual Wellness questionnaire and have noted 1. The patient's medical and social history 2. Their use of alcohol, tobacco or illicit drugs 3. Their current medications and supplements 4. The patient's  functional ability including ADL's, fall risks, home safety risks and hearing or visual             impairment. 5. Diet and physical activities 6. Evidence for depression or mood disorders 7. Reviewed Updated provider list, see scanned forms and [facility] Snapshot.  8. Reviewed whether or not the patient has HCPOA or living will, and discussed what this means with the patient.  Recommended he bring in a copy for his chart in [facility].  The patients weight, height, BMI and visual acuity have been recorded in the chart I have made referrals, counseling and provided education to the patient based review of the above and I have provided the pt with a written personalized care plan for preventive services.  I have provided the patient with a copy of your personalized plan for preventive services. Instructed to take the time to review along with their updated medication list.  Disposition: No follow-ups on file.  Future Appointments  Date Time Provider [place]  01/24/2023 10:20 AM Stacey Sago, Frederico Hamman, MD [facility]    No orders of the defined types were placed in this encounter.  There are no discontinued medications. No orders of the defined types were placed in this encounter.   Signed,  Maud Deed. Tennessee Perra, MD   Allergies as of 01/24/2023       Reactions   Sulfonamide Derivatives    Morphine And Related Rash        Medication List        Accurate as of January 23, 2023  1:27 PM. If you have any questions, ask your nurse or doctor.          aspirin 81 MG tablet Take 81 mg by mouth daily.   citalopram 40 MG tablet Commonly known as: CELEXA Take 1 tablet (40 mg total) by mouth daily.   DULoxetine 60 MG capsule Commonly known as: CYMBALTA Take 1 capsule (60 mg total) by mouth daily.   hydrochlorothiazide 12.5 MG tablet Commonly known as: HYDRODIURIL Take 1 tablet (12.5 mg total) by mouth daily.   simvastatin 10 MG tablet Commonly known as: ZOCOR Take 1 tablet (10 mg  total) by mouth daily.   triamcinolone ointment 0.1 % Commonly known as: KENALOG Apply 1 application. topically 2 (two) times daily.   zolpidem 10 MG tablet Commonly known as: AMBIEN Take 1 tablet (10 mg total) by mouth at bedtime as needed. for sleep

## 2023-01-24 ENCOUNTER — Encounter: Payer: Self-pay | Admitting: Family Medicine

## 2023-01-24 ENCOUNTER — Ambulatory Visit (INDEPENDENT_AMBULATORY_CARE_PROVIDER_SITE_OTHER): Payer: HMO | Admitting: Family Medicine

## 2023-01-24 VITALS — BP 120/70 | HR 72 | Temp 98.3°F | Ht 69.5 in | Wt 262.2 lb

## 2023-01-24 DIAGNOSIS — F411 Generalized anxiety disorder: Secondary | ICD-10-CM | POA: Diagnosis not present

## 2023-01-24 DIAGNOSIS — R7989 Other specified abnormal findings of blood chemistry: Secondary | ICD-10-CM | POA: Insufficient documentation

## 2023-01-24 DIAGNOSIS — D751 Secondary polycythemia: Secondary | ICD-10-CM

## 2023-01-24 DIAGNOSIS — Z0001 Encounter for general adult medical examination with abnormal findings: Secondary | ICD-10-CM | POA: Diagnosis not present

## 2023-01-24 DIAGNOSIS — I10 Essential (primary) hypertension: Secondary | ICD-10-CM

## 2023-01-24 DIAGNOSIS — Z1211 Encounter for screening for malignant neoplasm of colon: Secondary | ICD-10-CM

## 2023-01-24 DIAGNOSIS — G4733 Obstructive sleep apnea (adult) (pediatric): Secondary | ICD-10-CM | POA: Diagnosis not present

## 2023-01-24 DIAGNOSIS — E119 Type 2 diabetes mellitus without complications: Secondary | ICD-10-CM

## 2023-01-24 MED ORDER — BUSPIRONE HCL 5 MG PO TABS: 5.0000 mg | ORAL_TABLET | Freq: Two times a day (BID) | ORAL | 3 refills | Status: DC

## 2023-01-24 MED ORDER — SIMVASTATIN 40 MG PO TABS: 40.0000 mg | ORAL_TABLET | Freq: Every day | ORAL | 3 refills | Status: DC

## 2023-01-24 MED ORDER — METFORMIN HCL ER 500 MG PO TB24: 1000.0000 mg | ORAL_TABLET | Freq: Every day | ORAL | 1 refills | Status: DC

## 2023-02-02 ENCOUNTER — Telehealth: Payer: Self-pay | Admitting: Family Medicine

## 2023-02-02 ENCOUNTER — Encounter: Payer: Self-pay | Admitting: *Deleted

## 2023-02-02 DIAGNOSIS — G4733 Obstructive sleep apnea (adult) (pediatric): Secondary | ICD-10-CM

## 2023-02-02 NOTE — Telephone Encounter (Signed)
Patient would like to know if he can get an order for a sleep study? He is trying to receive a new cpap machine and he is trying to get it covered under his insurance,but they are stating that he needs a new sleep study to verify that he indeed has sleep apnea. He prefers the [facility].

## 2023-02-03 NOTE — Telephone Encounter (Signed)
Please return call  I sent in a referral to one of the sleep medicine doctors to have them help coordinate a new sleep study and management of his CPAP machine.

## 2023-02-05 ENCOUNTER — Encounter (HOSPITAL_BASED_OUTPATIENT_CLINIC_OR_DEPARTMENT_OTHER): Payer: Self-pay

## 2023-02-05 NOTE — Telephone Encounter (Signed)
Tyler Ramsey notified as instructed by telephone.  He states he has already be contacted and has his initial consult already scheduled.

## 2023-02-05 NOTE — Telephone Encounter (Signed)
Called Mr. Angelos.  His voicemail is not set up so I was unable to leave a message.

## 2023-02-08 ENCOUNTER — Ambulatory Visit
Admission: RE | Admit: 2023-02-08 | Discharge: 2023-02-08 | Disposition: A | Discharge status: Home / Self Care | Payer: HMO | Source: Ambulatory Visit | Attending: Family Medicine | Admitting: Family Medicine

## 2023-02-08 DIAGNOSIS — R7989 Other specified abnormal findings of blood chemistry: Secondary | ICD-10-CM

## 2023-02-08 DIAGNOSIS — R945 Abnormal results of liver function studies: Secondary | ICD-10-CM | POA: Diagnosis not present

## 2023-02-16 ENCOUNTER — Other Ambulatory Visit: Payer: Self-pay | Admitting: Family Medicine

## 2023-02-21 ENCOUNTER — Ambulatory Visit (INDEPENDENT_AMBULATORY_CARE_PROVIDER_SITE_OTHER): Payer: HMO | Admitting: Pulmonary Disease

## 2023-02-21 ENCOUNTER — Other Ambulatory Visit: Payer: Self-pay | Admitting: *Deleted

## 2023-02-21 ENCOUNTER — Encounter: Payer: Self-pay | Admitting: Pulmonary Disease

## 2023-02-21 ENCOUNTER — Telehealth: Payer: Self-pay | Admitting: *Deleted

## 2023-02-21 ENCOUNTER — Telehealth: Payer: Self-pay

## 2023-02-21 VITALS — BP 134/88 | HR 88 | Ht 70.0 in | Wt 263.0 lb

## 2023-02-21 DIAGNOSIS — G4733 Obstructive sleep apnea (adult) (pediatric): Secondary | ICD-10-CM | POA: Diagnosis not present

## 2023-02-21 DIAGNOSIS — Z1211 Encounter for screening for malignant neoplasm of colon: Secondary | ICD-10-CM

## 2023-02-21 MED ORDER — PEG 3350-KCL-NABCB-NACL-NASULF 236 G PO SOLR: 4000.0000 mL | Freq: Once | ORAL | 0 refills | Status: AC

## 2023-02-21 NOTE — Telephone Encounter (Signed)
Colonoscopy schedule on 03/30/2023 with Dr Tobi Bastos

## 2023-02-21 NOTE — Telephone Encounter (Signed)
Gastroenterology Pre-Procedure Review  Request Date: 03/30/2023 Requesting Physician: Dr. Tobi Bastos  PATIENT REVIEW QUESTIONS: The patient responded to the following health history questions as indicated:    1. Are you having any GI issues? no 2. Do you have a personal history of Polyps? no 3. Do you have a family history of Colon Cancer or Polyps? no 4. Diabetes Mellitus? yes (met) 5. Joint replacements in the past 12 months?no 6. Major health problems in the past 3 months?no 7. Any artificial heart valves, MVP, or defibrillator?no    MEDICATIONS & ALLERGIES:    Patient reports the following regarding taking any anticoagulation/antiplatelet therapy:   Plavix, Coumadin, Eliquis, Xarelto, Lovenox, Pradaxa, Brilinta, or Effient? no Aspirin? yes (81)  Patient confirms/reports the following medications:  Current Outpatient Medications  Medication Sig Dispense Refill   aspirin 81 MG tablet Take 81 mg by mouth daily.     busPIRone (BUSPAR) 5 MG tablet TAKE 1 TABLET BY MOUTH TWICE A DAY 180 tablet 0   citalopram (CELEXA) 40 MG tablet Take 1 tablet (40 mg total) by mouth daily. 90 tablet 0   DULoxetine (CYMBALTA) 60 MG capsule Take 1 capsule (60 mg total) by mouth daily. 90 capsule 0   hydrochlorothiazide (HYDRODIURIL) 12.5 MG tablet Take 1 tablet (12.5 mg total) by mouth daily. 90 tablet 0   metFORMIN (GLUCOPHAGE-XR) 500 MG 24 hr tablet Take 2 tablets (1,000 mg total) by mouth daily with breakfast. 180 tablet 1   simvastatin (ZOCOR) 40 MG tablet Take 1 tablet (40 mg total) by mouth daily. 90 tablet 3   triamcinolone ointment (KENALOG) 0.1 % Apply 1 application. topically 2 (two) times daily. 30 g 1   zolpidem (AMBIEN) 10 MG tablet Take 1 tablet (10 mg total) by mouth at bedtime as needed. for sleep 30 tablet 5   No current facility-administered medications for this visit.    Patient confirms/reports the following allergies:  Allergies  Allergen Reactions   Sulfonamide Derivatives     Morphine And Related Rash    No orders of the defined types were placed in this encounter.   AUTHORIZATION INFORMATION Primary Insurance: 1D#: Group #:  Secondary Insurance: 1D#: Group #:  SCHEDULE INFORMATION: Date: 03/30/2023 Time: Location:  [facility]

## 2023-02-21 NOTE — Telephone Encounter (Signed)
Patient left a voicemail and states that he called and left a voicemail last week to schedule his colonoscopy and is calling today to schedule. He would like a call back today.

## 2023-02-21 NOTE — Patient Instructions (Signed)
We will schedule you for an in lab split-night study to ascertain severity of sleep disordered breathing and titrate you to the optimal pressure  We will see you back in about 3 months tentatively we want to see you back after you get the study done and being on a new machine for a few weeks  Continue using current machine  Continue weight loss efforts  Continue graded exercises as tolerated  Call with significant concerns

## 2023-02-21 NOTE — Progress Notes (Signed)
Tyler Ramsey    [id number]    Dec 07, 1955  Primary Care Physician:Copland, Karleen Hampshire, MD  Referring Physician: Hannah Beat, MD [address] [place],  [place] [address]  Chief complaint:   Patient being seen for obstructive sleep apnea  HPI:  Diagnosed with obstructive sleep apnea about 1999 has been using CPAP since Current machine is about 68 years old Was obtained from [employer]  Machine is given warnings of past motor life  He feels rested when he wakes up in the morning Uses CPAP nightly  Last CPAP titration was 2013 Usually goes to bed about 11 PM, falls asleep in about 30 minutes A few awakenings Final wake up time between 8 and 9 AM  Weight is up about 20 pounds  Did lose a lot of weight with gastric bypass surgery previously Unfortunately was not able to keep up with a lifestyle changes and gained back some weight  He does use CPAP nightly, obtains his supplies online  He does have a history of hypertension which is well-controlled history of diabetes well-controlled Wakes up feeling rested No significant dryness of his mouth in the mornings No morning headaches Functions well on a day-to-day basis  Never smoker  Not very sleepy during the day  Outpatient Encounter Medications as of 02/21/2023  Medication Sig   aspirin 81 MG tablet Take 81 mg by mouth daily.   busPIRone (BUSPAR) 5 MG tablet TAKE 1 TABLET BY MOUTH TWICE A DAY   citalopram (CELEXA) 40 MG tablet Take 1 tablet (40 mg total) by mouth daily.   DULoxetine (CYMBALTA) 60 MG capsule Take 1 capsule (60 mg total) by mouth daily.   hydrochlorothiazide (HYDRODIURIL) 12.5 MG tablet Take 1 tablet (12.5 mg total) by mouth daily.   metFORMIN (GLUCOPHAGE-XR) 500 MG 24 hr tablet Take 2 tablets (1,000 mg total) by mouth daily with breakfast.   simvastatin (ZOCOR) 40 MG tablet Take 1 tablet (40 mg total) by mouth daily.   triamcinolone ointment (KENALOG) 0.1 % Apply 1 application. topically  2 (two) times daily.   zolpidem (AMBIEN) 10 MG tablet Take 1 tablet (10 mg total) by mouth at bedtime as needed. for sleep   No facility-administered encounter medications on file as of 02/21/2023.    Allergies as of 02/21/2023 - Review Complete 02/21/2023  Allergen Reaction Noted   Sulfonamide derivatives  10/29/2008   Morphine and related Rash 06/03/2012    Past Medical History:  Diagnosis Date   Allergic rhinitis    Depression    Diabetes mellitus    Hyperlipidemia    Hypertension    Hypogonadism in male 11/29/2014   Sleep apnea     Past Surgical History:  Procedure Laterality Date   KNEE ARTHROSCOPY W/ ACL RECONSTRUCTION AND HAMSTRING GRAFT  1985   TONSILLECTOMY  1969   TOTAL KNEE ARTHROPLASTY  5/11   Left, Dr Carma Lair    Family History  Problem Relation Age of Onset   Emphysema Father    Heart disease Mother     Social History   Socioeconomic History   Marital status: Divorced    Spouse name: Not on file   Number of children: 1   Years of education: college   Highest education level: Not on file  Occupational History   Occupation: Community education officer, 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   Lives with significant other.   Right-handed.   No daily use of caffeine.   Social Determinants of Health   Financial Resource Strain: Not on file  Food Insecurity: Not on file  Transportation Needs: Not on file  Physical Activity: Not on file  Stress: Not on file  Social Connections: Not on file  Intimate Partner Violence: Not on file    Review of Systems  Respiratory:  Positive for apnea.   Psychiatric/Behavioral:  Positive for sleep disturbance.      Vitals:   02/21/23 1404  BP: 134/88  Pulse: 88  SpO2: 99%     Physical Exam Constitutional:      Appearance: He is obese.  HENT:     Head: Normocephalic.     Mouth/Throat:     Mouth: Mucous membranes are moist.  Eyes:     General: No scleral icterus.    Pupils: Pupils are equal, round, and reactive to light.  Cardiovascular:     Rate and Rhythm: Normal rate and regular rhythm.     Heart sounds: No murmur heard.    No friction rub.  Pulmonary:     Effort: No respiratory distress.     Breath sounds: No stridor. No wheezing or rhonchi.  Musculoskeletal:     Cervical back: No rigidity or tenderness.  Neurological:     Mental Status: He is alert.  Psychiatric:        Mood and Affect: Mood normal.       02/21/2023    2:00 PM  Results of the Epworth flowsheet  Sitting and reading 0  Watching TV 0  Sitting, inactive in a public place (e.g. a theatre or a meeting) 0  As a passenger in a car for an hour without a break 0  Lying down to rest in the afternoon when circumstances permit 3  Sitting and talking to someone 0  Sitting quietly after a lunch without alcohol 0  In a car, while stopped for a few minutes in traffic 0  Total score 3    Data Reviewed: Compliance data not available to be reviewed  Assessment:  History of obstructive sleep apnea -Compliant with CPAP therapy -Machine is past his motor life  Class II obesity -Continues to work on weight loss efforts -S/p bariatric surgery in the past  History of depression Generalized anxiety disorder  Plan/Recommendations: Schedule patient for an in lab split-night study  Continue current device  Call us with significant concerns  Tentative follow-up in about 3 months   Virl Diamond MD [facility] 02/21/2023, 2:23 PM  CC: Hannah Beat, MD

## 2023-02-27 ENCOUNTER — Other Ambulatory Visit: Payer: Self-pay | Admitting: Family Medicine

## 2023-02-28 ENCOUNTER — Other Ambulatory Visit: Payer: Self-pay | Admitting: Family Medicine

## 2023-03-13 ENCOUNTER — Encounter: Payer: Self-pay | Admitting: Internal Medicine

## 2023-03-13 ENCOUNTER — Ambulatory Visit (INDEPENDENT_AMBULATORY_CARE_PROVIDER_SITE_OTHER): Payer: HMO | Admitting: Internal Medicine

## 2023-03-13 VITALS — BP 122/72 | HR 67 | Temp 97.1°F | Ht 70.0 in | Wt 265.0 lb

## 2023-03-13 DIAGNOSIS — M79601 Pain in right arm: Secondary | ICD-10-CM | POA: Diagnosis not present

## 2023-03-13 NOTE — Assessment & Plan Note (Signed)
Seems like recurrence of his reaction to COVID vaccine but milder He is on duloxetine for this--but would not change dose now (due to milder and still functional) Observation only Other consideration is cervical radiculopathy---but doesn't seem to be Will update Dr Patsy Lager

## 2023-03-13 NOTE — Progress Notes (Signed)
Subjective:    Patient ID: Tyler Ramsey, male    DOB: July 03, 1956, 67 y.o.   MRN: [id number]  HPI Here due to right sided discomfort  Had reaction ot COVID vaccine in 2021 Went through extensive testing---with no results Then went to [facility] neurology--had NCS Had tremor then--but that is gone  Better for about 2 years but recurred a week ago Not as severe this time Still able to work Doesn't feel weak like before  Feels it in right shoulder, wrist Mimics allergic reaction  Current Outpatient Medications on File Prior to Visit  Medication Sig Dispense Refill   aspirin 81 MG tablet Take 81 mg by mouth daily.     busPIRone (BUSPAR) 5 MG tablet TAKE 1 TABLET BY MOUTH TWICE A DAY 180 tablet 0   citalopram (CELEXA) 40 MG tablet TAKE 1 TABLET BY MOUTH EVERY DAY 90 tablet 0   DULoxetine (CYMBALTA) 60 MG capsule TAKE 1 CAPSULE BY MOUTH EVERY DAY 90 capsule 0   hydrochlorothiazide (HYDRODIURIL) 12.5 MG tablet TAKE 1 TABLET BY MOUTH EVERY DAY 90 tablet 0   metFORMIN (GLUCOPHAGE-XR) 500 MG 24 hr tablet Take 2 tablets (1,000 mg total) by mouth daily with breakfast. 180 tablet 1   simvastatin (ZOCOR) 40 MG tablet Take 1 tablet (40 mg total) by mouth daily. 90 tablet 3   triamcinolone ointment (KENALOG) 0.1 % Apply 1 application. topically 2 (two) times daily. 30 g 1   zolpidem (AMBIEN) 10 MG tablet Take 1 tablet (10 mg total) by mouth at bedtime as needed. for sleep 30 tablet 5   No current facility-administered medications on file prior to visit.    Allergies  Allergen Reactions   Sulfonamide Derivatives    Morphine And Codeine Rash    Past Medical History:  Diagnosis Date   Allergic rhinitis    Depression    Diabetes mellitus    Hyperlipidemia    Hypertension    Hypogonadism in male 11/29/2014   Sleep apnea     Past Surgical History:  Procedure Laterality Date   KNEE ARTHROSCOPY W/ ACL RECONSTRUCTION AND HAMSTRING GRAFT  1985   TONSILLECTOMY  1969   TOTAL KNEE  ARTHROPLASTY  5/11   Left, Dr Carma Lair    Family History  Problem Relation Age of Onset   Emphysema Father    Heart disease Mother     Social History   Socioeconomic History   Marital status: Divorced    Spouse name: Not on file   Number of children: 1   Years of education: college   Highest education level: Not on file  Occupational History   Occupation: Community education officer, 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   Lives with significant other.   Right-handed.   No daily use of caffeine.   Social Determinants of Health   Financial Resource Strain: Not on file  Food Insecurity: Not on file  Transportation Needs: Not on file  Physical Activity: Not on file  Stress: Not on file  Social Connections: Not on file  Intimate Partner Violence: Not on file   Review of Systems No recent travel Does have  a lot of stress Had been sleeping fine--but some problems since this Appetite is fine No fever--but some sweat Sugars still elevated---working with Dr C on this    Objective:   Physical Exam Constitutional:      Appearance: Normal appearance.  Neck:     Comments: Fairly normal ROM  No tenderness Musculoskeletal:     Comments: Fairly normal ROM in right shoulder/elbow/wrist No tenderness  Lymphadenopathy:     Upper Body:     Right upper body: No axillary adenopathy.  Neurological:     Mental Status: He is alert.     Comments: No arm weakness            Assessment & Plan:

## 2023-03-22 ENCOUNTER — Telehealth: Payer: Self-pay | Admitting: Pulmonary Disease

## 2023-03-22 NOTE — Telephone Encounter (Signed)
Patient called to check the status of a sleep study appt.  He would like to know if he still will be contacted for an appt.  Please advise and call patient with an update.  CB# [phone]

## 2023-03-23 ENCOUNTER — Encounter: Payer: Self-pay | Admitting: Gastroenterology

## 2023-03-23 NOTE — Telephone Encounter (Signed)
ATC patient--unable to leave vm due to mailbox not being setup.  °Will call back.  ° °

## 2023-03-27 NOTE — Telephone Encounter (Signed)
Called and spoke to pt about split night. Pts states he was wanting an update on getting scheduled for a split night study which was ordered 02/21/23. [facility] please advise

## 2023-03-29 NOTE — Telephone Encounter (Signed)
[facility] spoke to the patient and got them scheduled for 04/23/2023

## 2023-03-30 ENCOUNTER — Ambulatory Visit
Admission: RE | Admit: 2023-03-30 | Discharge: 2023-03-30 | Disposition: A | Discharge status: Home / Self Care | Payer: HMO | Attending: Gastroenterology | Admitting: Gastroenterology

## 2023-03-30 ENCOUNTER — Encounter
Admission: RE | Disposition: A | Discharge status: Home / Self Care | Payer: Self-pay | Source: Home / Self Care | Attending: Gastroenterology

## 2023-03-30 ENCOUNTER — Ambulatory Visit: Payer: HMO | Admitting: Anesthesiology

## 2023-03-30 ENCOUNTER — Encounter: Payer: Self-pay | Admitting: Gastroenterology

## 2023-03-30 ENCOUNTER — Other Ambulatory Visit: Payer: Self-pay

## 2023-03-30 DIAGNOSIS — F32A Depression, unspecified: Secondary | ICD-10-CM | POA: Diagnosis not present

## 2023-03-30 DIAGNOSIS — G4730 Sleep apnea, unspecified: Secondary | ICD-10-CM | POA: Insufficient documentation

## 2023-03-30 DIAGNOSIS — E119 Type 2 diabetes mellitus without complications: Secondary | ICD-10-CM | POA: Diagnosis not present

## 2023-03-30 DIAGNOSIS — K5730 Diverticulosis of large intestine without perforation or abscess without bleeding: Secondary | ICD-10-CM | POA: Diagnosis not present

## 2023-03-30 DIAGNOSIS — Z7984 Long term (current) use of oral hypoglycemic drugs: Secondary | ICD-10-CM | POA: Insufficient documentation

## 2023-03-30 DIAGNOSIS — Z79899 Other long term (current) drug therapy: Secondary | ICD-10-CM | POA: Diagnosis not present

## 2023-03-30 DIAGNOSIS — F419 Anxiety disorder, unspecified: Secondary | ICD-10-CM | POA: Insufficient documentation

## 2023-03-30 DIAGNOSIS — E785 Hyperlipidemia, unspecified: Secondary | ICD-10-CM | POA: Diagnosis not present

## 2023-03-30 DIAGNOSIS — Z1211 Encounter for screening for malignant neoplasm of colon: Secondary | ICD-10-CM

## 2023-03-30 DIAGNOSIS — I10 Essential (primary) hypertension: Secondary | ICD-10-CM | POA: Diagnosis not present

## 2023-03-30 HISTORY — PX: COLONOSCOPY WITH PROPOFOL: SHX5780

## 2023-03-30 LAB — GLUCOSE, CAPILLARY: Glucose-Capillary: 163 mg/dL — ABNORMAL HIGH (ref 70–99)

## 2023-03-30 SURGERY — COLONOSCOPY WITH PROPOFOL
Anesthesia: General

## 2023-03-30 MED ORDER — PROPOFOL 500 MG/50ML IV EMUL
INTRAVENOUS | Status: DC | PRN
  Administered 2023-03-30: 30 mg via INTRAVENOUS
  Administered 2023-03-30: 50 mg via INTRAVENOUS
  Administered 2023-03-30: 150 ug/kg/min via INTRAVENOUS

## 2023-03-30 MED ORDER — LIDOCAINE HCL (CARDIAC) PF 100 MG/5ML IV SOSY
PREFILLED_SYRINGE | INTRAVENOUS | Status: DC | PRN
  Administered 2023-03-30: 100 mg via INTRAVENOUS

## 2023-03-30 MED ORDER — SODIUM CHLORIDE 0.9 % IV SOLN: INTRAVENOUS | Status: DC

## 2023-03-30 MED ORDER — DEXMEDETOMIDINE HCL IN NACL 80 MCG/20ML IV SOLN
INTRAVENOUS | Status: DC | PRN
  Administered 2023-03-30: 8 ug via INTRAVENOUS

## 2023-03-30 NOTE — Anesthesia Postprocedure Evaluation (Signed)
Anesthesia Post Note  Patient: Tyler Ramsey  Procedure(s) Performed: COLONOSCOPY WITH PROPOFOL  Patient location during evaluation: PACU Anesthesia Type: General Level of consciousness: awake and alert, oriented and patient cooperative Pain management: pain level controlled Vital Signs Assessment: post-procedure vital signs reviewed and stable Respiratory status: spontaneous breathing, nonlabored ventilation and respiratory function stable Cardiovascular status: blood pressure returned to baseline and stable Postop Assessment: adequate PO intake Anesthetic complications: no   There were no known notable events for this encounter.   Last Vitals:  Vitals:   03/30/23 1120 03/30/23 1128  BP:  (!) 177/98  Pulse: 80 75  Resp: 14 15  Temp:    SpO2: 98% 99%    Last Pain:  Vitals:   03/30/23 1017  TempSrc: Temporal  PainSc: 0-No pain                 Reed Breech

## 2023-03-30 NOTE — Transfer of Care (Signed)
Immediate Anesthesia Transfer of Care Note  Patient: JOCSAN MCGINLEY  Procedure(s) Performed: COLONOSCOPY WITH PROPOFOL  Patient Location: PACU  Anesthesia Type:General  Level of Consciousness: drowsy  Airway & Oxygen Therapy: Patient Spontanous Breathing  Post-op Assessment: Report given to RN and Post -op Vital signs reviewed and stable  Post vital signs: stable  Last Vitals:  Vitals Value Taken Time  BP 130/81 03/30/23 1105  Temp    Pulse 78 03/30/23 1108  Resp 18 03/30/23 1108  SpO2 98 % 03/30/23 1108  Vitals shown include unvalidated device data.  Last Pain:  Vitals:   03/30/23 1017  TempSrc: Temporal  PainSc: 0-No pain         Complications: No notable events documented.

## 2023-03-30 NOTE — Anesthesia Preprocedure Evaluation (Addendum)
Anesthesia Evaluation  Patient identified by MRN, date of birth, ID band Patient awake    Reviewed: Allergy & Precautions, NPO status , Patient's Chart, lab work & pertinent test results  History of Anesthesia Complications Negative for: history of anesthetic complications  Airway Mallampati: I   Neck ROM: Full    Dental  (+) Missing   Pulmonary sleep apnea    Pulmonary exam normal breath sounds clear to auscultation       Cardiovascular hypertension, Normal cardiovascular exam Rhythm:Regular Rate:Normal     Neuro/Psych  PSYCHIATRIC DISORDERS Anxiety Depression    negative neurological ROS     GI/Hepatic negative GI ROS,,,  Endo/Other  diabetes, Type 2    Renal/GU      Musculoskeletal   Abdominal   Peds  Hematology negative hematology ROS (+)   Anesthesia Other Findings   Reproductive/Obstetrics                             Anesthesia Physical Anesthesia Plan  ASA: 2  Anesthesia Plan: General   Post-op Pain Management:    Induction: Intravenous  PONV Risk Score and Plan: 2 and Propofol infusion, TIVA and Treatment may vary due to age or medical condition  Airway Management Planned: Natural Airway  Additional Equipment:   Intra-op Plan:   Post-operative Plan:   Informed Consent: I have reviewed the patients History and Physical, chart, labs and discussed the procedure including the risks, benefits and alternatives for the proposed anesthesia with the patient or authorized representative who has indicated his/her understanding and acceptance.       Plan Discussed with: CRNA  Anesthesia Plan Comments: (LMA/GETA backup discussed.  Patient consented for risks of anesthesia including but not limited to:  - adverse reactions to medications - damage to eyes, teeth, lips or other oral mucosa - nerve damage due to positioning  - sore throat or hoarseness - damage to heart,  brain, nerves, lungs, other parts of body or loss of life  Informed patient about role of CRNA in peri- and intra-operative care.  Patient voiced understanding.)       Anesthesia Quick Evaluation

## 2023-03-30 NOTE — H&P (Signed)
Wyline Mood, MD [address], [place], [place], [address] [address], [place], [place], [address] Phone: [phone]  Fax: [phone]  Primary Care Physician:  Hannah Beat, MD   Pre-Procedure History & Physical: HPI:  Tyler Ramsey is a 67 y.o. male is here for an colonoscopy.   Past Medical History:  Diagnosis Date   Allergic rhinitis    Depression    Diabetes mellitus    Hyperlipidemia    Hypertension    Hypogonadism in male 11/29/2014   Sleep apnea     Past Surgical History:  Procedure Laterality Date   KNEE ARTHROSCOPY W/ ACL RECONSTRUCTION AND HAMSTRING GRAFT  1985   TONSILLECTOMY  1969   TOTAL KNEE ARTHROPLASTY  5/11   Left, Dr Carma Lair    Prior to Admission medications   Medication Sig Start Date End Date Taking? Authorizing Provider  aspirin 81 MG tablet Take 81 mg by mouth daily.    [provider]  busPIRone (BUSPAR) 5 MG tablet TAKE 1 TABLET BY MOUTH TWICE A DAY 02/16/23   Copland, Karleen Hampshire, MD  citalopram (CELEXA) 40 MG tablet TAKE 1 TABLET BY MOUTH EVERY DAY 02/28/23   Copland, Karleen Hampshire, MD  DULoxetine (CYMBALTA) 60 MG capsule TAKE 1 CAPSULE BY MOUTH EVERY DAY 02/27/23   Copland, Karleen Hampshire, MD  hydrochlorothiazide (HYDRODIURIL) 12.5 MG tablet TAKE 1 TABLET BY MOUTH EVERY DAY 02/27/23   Copland, Karleen Hampshire, MD  metFORMIN (GLUCOPHAGE-XR) 500 MG 24 hr tablet Take 2 tablets (1,000 mg total) by mouth daily with breakfast. 01/24/23   Copland, Karleen Hampshire, MD  simvastatin (ZOCOR) 40 MG tablet Take 1 tablet (40 mg total) by mouth daily. 01/24/23   Copland, Karleen Hampshire, MD  triamcinolone ointment (KENALOG) 0.1 % Apply 1 application. topically 2 (two) times daily. 01/09/22   Copland, Karleen Hampshire, MD  zolpidem (AMBIEN) 10 MG tablet Take 1 tablet (10 mg total) by mouth at bedtime as needed. 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   Lives with significant other.   Right-handed.   No daily use of caffeine.   Social Determinants of Health   Financial Resource Strain: Not on file  Food Insecurity: Not on file  Transportation Needs: Not on file  Physical Activity: Not on file  Stress: Not on file  Social Connections: Not on file  Intimate Partner Violence: Not on file    Review of Systems: See HPI, otherwise negative ROS  Physical Exam:  There were no vitals taken for this visit. General:   Alert,  pleasant and cooperative in NAD Head:  Normocephalic and atraumatic. Neck:  Supple; no masses or thyromegaly. Lungs:  Clear throughout to auscultation, normal respiratory effort.    Heart:  +S1, +S2, Regular rate and rhythm, No edema. Abdomen:  Soft, nontender and nondistended. Normal bowel sounds, without guarding, and without rebound.   Neurologic:  Alert and  oriented x4;  grossly normal neurologically.  Impression/Plan: Tyler Ramsey is here for an colonoscopy to be performed for Screening colonoscopy average risk   Risks, benefits, limitations, and alternatives regarding  colonoscopy have been reviewed with the patient.  Questions  have been answered.  All parties agreeable.   Wyline Mood, MD  03/30/2023, 9:54 AM

## 2023-03-30 NOTE — Op Note (Signed)
[facility] Patient Name: Tyler Ramsey Procedure Date: 03/30/2023 10:42 AM MRN: [id number] Account #: [id number] Date of Birth: [birth year] Admit Type: Outpatient Age: 67 Room: [facility] ENDO ROOM 4 Gender: Male Note Status: Finalized Instrument Name: Nelda Marseille [id number] Procedure:             Colonoscopy Indications:           Screening for colorectal malignant neoplasm Providers:             Wyline Mood MD, MD Referring MD:          Wyline Mood MD, MD (Referring MD), Elpidio Galea. Copland                         MD, MD (Referring MD) Medicines:             Monitored Anesthesia Care Complications:         No immediate complications. Procedure:             Pre-Anesthesia Assessment:                        - Prior to the procedure, a History and Physical was                         performed, and patient medications, allergies and                         sensitivities were reviewed. The patient's tolerance                         of previous anesthesia was reviewed.                        - The risks and benefits of the procedure and the                         sedation options and risks were discussed with the                         patient. All questions were answered and informed                         consent was obtained.                        - ASA Grade Assessment: II - A patient with mild                         systemic disease.                        After obtaining informed consent, the colonoscope was                         passed under direct vision. Throughout the procedure,                         the patient's blood pressure, pulse, and oxygen  saturations were monitored continuously. The                         Colonoscope was introduced through the anus and                         advanced to the the cecum, identified by the                         appendiceal orifice. The colonoscopy was performed                          with ease. The patient tolerated the procedure well.                         The quality of the bowel preparation was excellent.                         The ileocecal valve, appendiceal orifice, and rectum                         were photographed. Findings:      The perianal and digital rectal examinations were normal.      A few small-mouthed diverticula were found in the left colon.      The exam was otherwise without abnormality on direct and retroflexion       views. Impression:            - Diverticulosis in the left colon.                        - The examination was otherwise normal on direct and                         retroflexion views.                        - No specimens collected. Recommendation:        - Discharge patient to home (with escort).                        - Resume previous diet.                        - Continue present medications.                        - Repeat colonoscopy is not recommended due to current                         age (62 years or older) for surveillance. Procedure Code(s):     --- Professional ---                        [phone], Colonoscopy, flexible; diagnostic, including                         collection of specimen(s) by brushing or washing, when                         performed (  separate procedure) Diagnosis Code(s):     --- Professional ---                        Z12.11, Encounter for screening for malignant neoplasm                         of colon                        K57.30, Diverticulosis of large intestine without                         perforation or abscess without bleeding CPT copyright 2022 American Medical Association. All rights reserved. The codes documented in this report are preliminary and upon coder review may  be revised to meet current compliance requirements. Wyline Mood, MD Wyline Mood MD, MD 03/30/2023 11:03:34 AM This report has been signed electronically. Number of Addenda: 0 Note Initiated On:  03/30/2023 10:42 AM Scope Withdrawal Time: 0 hours 9 minutes 13 seconds  Total Procedure Duration: 0 hours 11 minutes 45 seconds  Estimated Blood Loss:  Estimated blood loss: none.      [facility]

## 2023-04-02 ENCOUNTER — Encounter: Payer: Self-pay | Admitting: Gastroenterology

## 2023-04-04 ENCOUNTER — Other Ambulatory Visit: Payer: Self-pay | Admitting: Family Medicine

## 2023-04-16 ENCOUNTER — Ambulatory Visit: Payer: HMO | Admitting: Family Medicine

## 2023-04-22 NOTE — Progress Notes (Unsigned)
    Tyler Ramsey T. Tyler Gallogly, MD, CAQ Sports Medicine [facility] at [facility] [address] [place], [address]  Phone: [phone]  FAX: [phone]  Tyler Ramsey - 67 y.o. male  MRN [id number]  Date of Birth: 06/07/1956  Date: 04/23/2023  PCP: Hannah Beat, MD  Referral: Hannah Beat, MD  No chief complaint on file.  Subjective:   Tyler Ramsey is a 67 y.o. very pleasant male patient with There is no height or weight on file to calculate BMI. who presents with the following:  Tyler Ramsey is here to follow-up about his blood pressure.  Blood sugars have also been high recently, so we are going to follow-up on his diabetes, as well.  Was able to stop drinking alcohol.   Counselling - here at our office.   Tripped and fell about a month ago  HTN: Tolerating all medications without side effects Stable and at goal No CP, no sob. No HA.  BP Readings from Last 3 Encounters:  03/30/23 (!) 177/98  03/13/23 122/72  02/21/23 134/88    Basic Metabolic Panel:    Component Value Date/Time   NA 135 01/18/2023 0839   NA 139 08/14/2012 1044   K 3.4 (L) 01/18/2023 0839   K 4.2 08/14/2012 1044   CL 91 (L) 01/18/2023 0839   CL 102 08/14/2012 1044   CO2 27 01/18/2023 0839   CO2 29 08/14/2012 1044   BUN 17 01/18/2023 0839   BUN 15 08/14/2012 1044   CREATININE 1.40 01/18/2023 0839   CREATININE 0.83 08/14/2012 1044   GLUCOSE 238 (H) 01/18/2023 0839   GLUCOSE 79 08/14/2012 1044   CALCIUM 10.1 01/18/2023 0839   CALCIUM 9.1 08/14/2012 1044    Diabetes Mellitus: Tolerating Medications: yes Compliance with diet: fair, There is no height or weight on file to calculate BMI. Exercise: minimal / intermittent Avg blood sugars at home: not checking Foot problems: none Hypoglycemia: none No nausea, vomitting, blurred vision, polyuria.  Lab Results  Component Value Date   HGBA1C 8.2 (H) 01/18/2023   HGBA1C 7.5 (H) 01/02/2022   HGBA1C 5.8 12/15/2020    Lab Results  Component Value Date   MICROALBUR 5.7 (H) 01/18/2023   LDLCALC 129 (H) 01/18/2023   CREATININE 1.40 01/18/2023    Wt Readings from Last 3 Encounters:  03/30/23 262 lb (118.8 kg)  03/13/23 265 lb (120.2 kg)  02/21/23 263 lb (119.3 kg)     Review of Systems is noted in the HPI, as appropriate  Objective:   There were no vitals taken for this visit.  GEN: No acute distress; alert,appropriate. PULM: Breathing comfortably in no respiratory distress PSYCH: Normally interactive.   Laboratory and Imaging Data:  Assessment and Plan:   ***

## 2023-04-23 ENCOUNTER — Encounter: Payer: Self-pay | Admitting: Family Medicine

## 2023-04-23 ENCOUNTER — Ambulatory Visit (INDEPENDENT_AMBULATORY_CARE_PROVIDER_SITE_OTHER): Payer: HMO | Admitting: Family Medicine

## 2023-04-23 ENCOUNTER — Ambulatory Visit (HOSPITAL_BASED_OUTPATIENT_CLINIC_OR_DEPARTMENT_OTHER): Payer: HMO | Attending: Pulmonary Disease | Admitting: Pulmonary Disease

## 2023-04-23 VITALS — BP 162/90 | HR 73 | Temp 97.1°F | Ht 70.0 in | Wt 260.4 lb

## 2023-04-23 DIAGNOSIS — I10 Essential (primary) hypertension: Secondary | ICD-10-CM | POA: Diagnosis not present

## 2023-04-23 DIAGNOSIS — D582 Other hemoglobinopathies: Secondary | ICD-10-CM

## 2023-04-23 DIAGNOSIS — F339 Major depressive disorder, recurrent, unspecified: Secondary | ICD-10-CM

## 2023-04-23 DIAGNOSIS — R7989 Other specified abnormal findings of blood chemistry: Secondary | ICD-10-CM | POA: Diagnosis not present

## 2023-04-23 DIAGNOSIS — E119 Type 2 diabetes mellitus without complications: Secondary | ICD-10-CM | POA: Diagnosis not present

## 2023-04-23 DIAGNOSIS — Z638 Other specified problems related to primary support group: Secondary | ICD-10-CM | POA: Diagnosis not present

## 2023-04-23 DIAGNOSIS — N1831 Chronic kidney disease, stage 3a: Secondary | ICD-10-CM

## 2023-04-23 DIAGNOSIS — G4733 Obstructive sleep apnea (adult) (pediatric): Secondary | ICD-10-CM | POA: Insufficient documentation

## 2023-04-23 DIAGNOSIS — G4736 Sleep related hypoventilation in conditions classified elsewhere: Secondary | ICD-10-CM | POA: Diagnosis not present

## 2023-04-23 LAB — CBC WITH DIFFERENTIAL/PLATELET
Basophils Absolute: 0 10*3/uL (ref 0.0–0.1)
Basophils Relative: 0.6 % (ref 0.0–3.0)
Eosinophils Absolute: 0.2 10*3/uL (ref 0.0–0.7)
Eosinophils Relative: 3.4 % (ref 0.0–5.0)
HCT: 53.3 % — ABNORMAL HIGH (ref 39.0–52.0)
Hemoglobin: 17.9 g/dL — ABNORMAL HIGH (ref 13.0–17.0)
Lymphocytes Relative: 26.3 % (ref 12.0–46.0)
Lymphs Abs: 1.9 10*3/uL (ref 0.7–4.0)
MCHC: 33.7 g/dL (ref 30.0–36.0)
MCV: 95.6 fl (ref 78.0–100.0)
Monocytes Absolute: 0.6 10*3/uL (ref 0.1–1.0)
Monocytes Relative: 8.4 % (ref 3.0–12.0)
Neutro Abs: 4.4 10*3/uL (ref 1.4–7.7)
Neutrophils Relative %: 61.3 % (ref 43.0–77.0)
Platelets: 250 10*3/uL (ref 150.0–400.0)
RBC: 5.58 Mil/uL (ref 4.22–5.81)
RDW: 12.6 % (ref 11.5–15.5)
WBC: 7.1 10*3/uL (ref 4.0–10.5)

## 2023-04-23 LAB — BASIC METABOLIC PANEL
BUN: 12 mg/dL (ref 6–23)
CO2: 30 mEq/L (ref 19–32)
Calcium: 10.2 mg/dL (ref 8.4–10.5)
Chloride: 98 mEq/L (ref 96–112)
Creatinine, Ser: 1.17 mg/dL (ref 0.40–1.50)
GFR: 64.27 mL/min (ref >60.00)
Glucose, Bld: 149 mg/dL — ABNORMAL HIGH (ref 70–99)
Potassium: 4.3 mEq/L (ref 3.5–5.1)
Sodium: 137 mEq/L (ref 135–145)

## 2023-04-23 LAB — HEPATIC FUNCTION PANEL
ALT: 87 U/L — ABNORMAL HIGH (ref 0–53)
AST: 111 U/L — ABNORMAL HIGH (ref 0–37)
Albumin: 4.6 g/dL (ref 3.5–5.2)
Alkaline Phosphatase: 81 U/L (ref 39–117)
Bilirubin, Direct: 0.2 mg/dL (ref 0.0–0.3)
Total Bilirubin: 0.9 mg/dL (ref 0.2–1.2)
Total Protein: 8.4 g/dL — ABNORMAL HIGH (ref 6.0–8.3)

## 2023-04-23 LAB — POCT GLYCOSYLATED HEMOGLOBIN (HGB A1C): Hemoglobin A1C: 7.2 % — AB (ref 4.0–5.6)

## 2023-04-23 MED ORDER — BUSPIRONE HCL 10 MG PO TABS: 10.0000 mg | ORAL_TABLET | Freq: Two times a day (BID) | ORAL | 1 refills | Status: DC

## 2023-04-23 MED ORDER — DULOXETINE HCL 60 MG PO CPEP: 60.0000 mg | ORAL_CAPSULE | Freq: Every day | ORAL | 3 refills | Status: DC

## 2023-04-23 MED ORDER — CITALOPRAM HYDROBROMIDE 40 MG PO TABS: 40.0000 mg | ORAL_TABLET | Freq: Every day | ORAL | 3 refills | Status: DC

## 2023-04-23 MED ORDER — ZOLPIDEM TARTRATE 10 MG PO TABS: 10.0000 mg | ORAL_TABLET | Freq: Every evening | ORAL | 5 refills | Status: DC | PRN

## 2023-04-23 MED ORDER — HYDROCHLOROTHIAZIDE 25 MG PO TABS: 25.0000 mg | ORAL_TABLET | Freq: Every day | ORAL | 1 refills | Status: DC

## 2023-04-24 ENCOUNTER — Telehealth: Payer: Self-pay | Admitting: Pulmonary Disease

## 2023-04-24 NOTE — Telephone Encounter (Signed)
I was calling PT to set up a FU after his sleep study. He would not make a FU appt to see Dr. Val Eagle.   He states he had a bad night in that he was not wearing a CPAP during the study.  He was likely not getting a good reading because he sleeps on his stomach and they would only allow that for half the night.  Bottom line is he just wants a new CPAP machine. His states  his says "Motor life has exceeded use" and he would not make a FU appt with me until we could address this.   Please call @ [phone]

## 2023-04-27 NOTE — Telephone Encounter (Signed)
Dr. Val Eagle please advise.  Do you want me to place order for cpap machine or does patient need to see you first for a f/u

## 2023-04-29 ENCOUNTER — Telehealth: Payer: Self-pay | Admitting: Pulmonary Disease

## 2023-04-29 DIAGNOSIS — G4733 Obstructive sleep apnea (adult) (pediatric): Secondary | ICD-10-CM | POA: Diagnosis not present

## 2023-04-29 NOTE — Telephone Encounter (Signed)
Call patient  Sleep study result  Date of study: 04/23/2023  Impression: Moderate obstructive sleep apnea Mild oxygen desaturations  Recommendation:  DME referral  Recommend CPAP therapy for moderate obstructive sleep apnea   Auto titrating CPAP may be used as an option of treatment, CPAP settings of 5-20 with heated humidification. Respironics nasal wisp mask,  small-medium sized mask.  Encourage weight loss measures  Follow-up in the office 4 to 6 weeks following initiation of treatment

## 2023-04-29 NOTE — Procedures (Signed)
POLYSOMNOGRAPHY  Last, First: Kaipo, Behning MRN: [id number] Gender: Male Age (years): 67 Weight (lbs): 261 DOB: 26-Feb-1956 BMI: 37 Primary Care: No PCP Epworth Score: 3 Referring: Tomma Lightning MD Technician: Ulyess Mort Interpreting: Tomma Lightning MD Study Type: NPSG Ordered Study Type: Split Night CPAP Study date: 04/23/2023 Location: [facility] CLINICAL INFORMATION Tyler Ramsey is a 67 year old Male and was referred to the sleep center for evaluation of G47.33 OSA: Adult and Pediatric (327.23). Indications include Diabetes, Fatigue, Hypertension, Morbid Obesity, Obesity, Snoring, Witnesses Apnea / Gasping During Sleep.  MEDICATIONS Patient self administered medications include: ZOLPIDEM TARTRATE. Medications administered during study include Sleep medicine administered - Zolpidem 5 mg at 09:03:00 PM  SLEEP STUDY TECHNIQUE A multi-channel overnight Polysomnography study was performed. The channels recorded and monitored were central and occipital EEG, electrooculogram (EOG), submentalis EMG (chin), nasal and oral airflow, thoracic and abdominal wall motion, anterior tibialis EMG, snore microphone, electrocardiogram, and a pulse oximetry. TECHNICIAN COMMENTS Comments added by Technician: PATIENT WAS ORDERED AS A SPLIT NIGHT STUDY Comments added by Scorer: N/A SLEEP ARCHITECTURE The study was initiated at 10:40:43 PM and terminated at 4:42:51 AM. The total recorded time was 362.1 minutes. EEG confirmed total sleep time was 188 minutes yielding a sleep efficiency of 51.9%. Sleep onset after lights out was 4.9 minutes with a REM latency of N/A minutes. The patient spent 45.5% of the night in stage N1 sleep, 54.5% in stage N2 sleep, 0.0% in stage N3 and 0% in REM. Wake after sleep onset (WASO) was 169.3 minutes. The Arousal Index was 70.9/hour. RESPIRATORY PARAMETERS There were a total of 60 respiratory disturbances out of which 17 were apneas ( 14 obstructive, 3 mixed, 0  central) and 43 hypopneas. The apnea/hypopnea index (AHI) was 19.1 events/hour. The central sleep apnea index was 0 events/hour. The REM AHI was N/A events/hour and NREM AHI was 19.1 events/hour. The supine AHI was 44.0 events/hour and the non supine AHI was 9 supine during 28.99% of sleep. Respiratory disturbances were associated with oxygen desaturation down to a nadir of 85.0% during sleep. The mean oxygen saturation during the study was 94.2%.   LEG MOVEMENT DATA The total leg movements were 309 with a resulting leg movement index of 98.6/hr .Associated arousal with leg movement index was 6.4/hr.  CARDIAC DATA The underlying cardiac rhythm was most consistent with sinus rhythm. Mean heart rate during sleep was 70.0 bpm. Additional rhythm abnormalities include PVCs.  IMPRESSIONS - Moderate Obstructive Sleep apnea(OSA) - Electrocardiographic data showed presence of PVCs. - Moderate Oxygen Desaturation - The patient snored with moderate snoring volume. - No significant periodic leg movements(PLMs) during sleep. Associated arousals were significant with an arousal index of 6.4 /hour.  DIAGNOSIS - Obstructive Sleep Apnea (G47.33) - Nocturnal Hypoxemia (G47.36)  RECOMMENDATIONS - Therapeutic CPAP titration to determine optimal pressure required to alleviate sleep disordered breathing. - Auto titrating CPAP may be used as an option of treatment, CPAP settings of 5-20 with heated humidification. Respironics nasal wisp mask, small-medium sized mask. - Positional therapy avoiding supine position during sleep. - Mirapex, Requip, or Sinemet for treatment of Periodic Leg Movements of Sleep. - Avoid alcohol, sedatives and other CNS depressants that may worsen sleep apnea and disrupt normal sleep architecture. - Sleep hygiene should be reviewed to assess factors that may improve sleep quality. - Weight management and regular exercise should be initiated or continued.  [Electronically signed]  04/29/2023 09:23 AM  Virl Diamond MD NPI: [id number]

## 2023-04-30 NOTE — Telephone Encounter (Signed)
Okay to place order for auto titrating CPAP call patient  Auto CPAP 5-20

## 2023-05-01 NOTE — Telephone Encounter (Signed)
Pt calling for results

## 2023-05-01 NOTE — Telephone Encounter (Signed)
ATC x1.  LVM x1.

## 2023-05-09 NOTE — Telephone Encounter (Signed)
Patient checking on order for CPAP machine. Patient phone number is [phone].

## 2023-05-10 NOTE — Telephone Encounter (Signed)
Order has been placed.Nothing else further needed at this time.

## 2023-05-14 NOTE — Telephone Encounter (Signed)
Please try again so we can close the encounter.

## 2023-05-14 NOTE — Telephone Encounter (Signed)
My chart message was sent to patient and order placed 05/10/23.

## 2023-06-03 NOTE — Progress Notes (Unsigned)
    Tyler Abdullah T. Svea Pusch, MD, CAQ Sports Medicine [facility] at [facility] [address] [place], [address]  Phone: [phone]  FAX: [phone]  Tyler Ramsey - 67 y.o. male  MRN [id number]  Date of Birth: [birth year]  Date: 06/04/2023  PCP: Hannah Beat, MD  Referral: Hannah Beat, MD  No chief complaint on file.  Subjective:   Tyler Ramsey is a 67 y.o. very pleasant male patient with There is no height or weight on file to calculate BMI. who presents with the following:  He is here to follow-up on 2 different fronts.  Follow-up depression, he had been having a lot of stress at home with his girlfriend's child having a significant drug problem, and some issues going on at home.  He did start doing some counseling.  He is currently on Celexa 40 mg, BuSpar 10 mg p.o. twice daily, and duloxetine 60 mg a day.  HTN: Tolerating all medications without side effects Stable and at goal No CP, no sob. No HA.  BP Readings from Last 3 Encounters:  04/23/23 (!) 162/90  03/30/23 (!) 177/98  03/13/23 122/72    Basic Metabolic Panel:    Component Value Date/Time   NA 137 04/23/2023 1116   NA 139 08/14/2012 1044   K 4.3 04/23/2023 1116   K 4.2 08/14/2012 1044   CL 98 04/23/2023 1116   CL 102 08/14/2012 1044   CO2 30 04/23/2023 1116   CO2 29 08/14/2012 1044   BUN 12 04/23/2023 1116   BUN 15 08/14/2012 1044   CREATININE 1.17 04/23/2023 1116   CREATININE 0.83 08/14/2012 1044   GLUCOSE 149 (H) 04/23/2023 1116   GLUCOSE 79 08/14/2012 1044   CALCIUM 10.2 04/23/2023 1116   CALCIUM 9.1 08/14/2012 1044     Review of Systems is noted in the HPI, as appropriate  Objective:   There were no vitals taken for this visit.  GEN: No acute distress; alert,appropriate. PULM: Breathing comfortably in no respiratory distress PSYCH: Normally interactive.   Laboratory and Imaging Data:  Assessment and Plan:   ***

## 2023-06-04 ENCOUNTER — Encounter: Payer: Self-pay | Admitting: Family Medicine

## 2023-06-04 ENCOUNTER — Telehealth: Payer: Self-pay | Admitting: Family Medicine

## 2023-06-04 ENCOUNTER — Ambulatory Visit (INDEPENDENT_AMBULATORY_CARE_PROVIDER_SITE_OTHER): Payer: HMO | Admitting: Family Medicine

## 2023-06-04 VITALS — BP 138/75 | HR 77 | Temp 98.4°F | Ht 70.0 in | Wt 256.2 lb

## 2023-06-04 DIAGNOSIS — I10 Essential (primary) hypertension: Secondary | ICD-10-CM | POA: Diagnosis not present

## 2023-06-04 DIAGNOSIS — R861 Abnormal level of hormones in specimens from male genital organs: Secondary | ICD-10-CM | POA: Diagnosis not present

## 2023-06-04 DIAGNOSIS — E785 Hyperlipidemia, unspecified: Secondary | ICD-10-CM | POA: Diagnosis not present

## 2023-06-04 DIAGNOSIS — R7989 Other specified abnormal findings of blood chemistry: Secondary | ICD-10-CM | POA: Diagnosis not present

## 2023-06-04 DIAGNOSIS — D582 Other hemoglobinopathies: Secondary | ICD-10-CM

## 2023-06-04 DIAGNOSIS — F339 Major depressive disorder, recurrent, unspecified: Secondary | ICD-10-CM

## 2023-06-04 DIAGNOSIS — E1169 Type 2 diabetes mellitus with other specified complication: Secondary | ICD-10-CM | POA: Diagnosis not present

## 2023-06-04 DIAGNOSIS — Z7984 Long term (current) use of oral hypoglycemic drugs: Secondary | ICD-10-CM

## 2023-06-04 LAB — CBC WITH DIFFERENTIAL/PLATELET
Basophils Absolute: 0.1 10*3/uL (ref 0.0–0.1)
Basophils Relative: 0.8 % (ref 0.0–3.0)
Eosinophils Absolute: 0.7 10*3/uL (ref 0.0–0.7)
Eosinophils Relative: 8 % — ABNORMAL HIGH (ref 0.0–5.0)
HCT: 49.3 % (ref 39.0–52.0)
Hemoglobin: 16.7 g/dL (ref 13.0–17.0)
Lymphocytes Relative: 27 % (ref 12.0–46.0)
Lymphs Abs: 2.3 10*3/uL (ref 0.7–4.0)
MCHC: 34 g/dL (ref 30.0–36.0)
MCV: 93.8 fl (ref 78.0–100.0)
Monocytes Absolute: 0.7 10*3/uL (ref 0.1–1.0)
Monocytes Relative: 8.2 % (ref 3.0–12.0)
Neutro Abs: 4.8 10*3/uL (ref 1.4–7.7)
Neutrophils Relative %: 56 % (ref 43.0–77.0)
Platelets: 246 10*3/uL (ref 150.0–400.0)
RBC: 5.25 Mil/uL (ref 4.22–5.81)
RDW: 12.7 % (ref 11.5–15.5)
WBC: 8.6 10*3/uL (ref 4.0–10.5)

## 2023-06-04 LAB — HEPATIC FUNCTION PANEL
ALT: 34 U/L (ref 0–53)
AST: 33 U/L (ref 0–37)
Albumin: 4.4 g/dL (ref 3.5–5.2)
Alkaline Phosphatase: 78 U/L (ref 39–117)
Bilirubin, Direct: 0.2 mg/dL (ref 0.0–0.3)
Total Bilirubin: 0.8 mg/dL (ref 0.2–1.2)
Total Protein: 7.7 g/dL (ref 6.0–8.3)

## 2023-06-04 LAB — LIPID PANEL
Cholesterol: 159 mg/dL (ref 0–200)
HDL: 41.7 mg/dL (ref >39.00)
NonHDL: 117.59
Total CHOL/HDL Ratio: 4
Triglycerides: 201 mg/dL — ABNORMAL HIGH (ref 0.0–149.0)
VLDL: 40.2 mg/dL — ABNORMAL HIGH (ref 0.0–40.0)

## 2023-06-04 LAB — LDL CHOLESTEROL, DIRECT: Direct LDL: 103 mg/dL

## 2023-06-04 NOTE — Telephone Encounter (Signed)
Returned patient's call.  Left voicemail that I was calling to get more about what we need to send in to his insurance company about his sleep study.  I advised I would call him back this afternoon.

## 2023-06-04 NOTE — Telephone Encounter (Signed)
Pt called in requesting a call back regarding a sleep study  that was done and refer by PCP stated he need paper to send to is insurance . # [phone] .Please advise # [phone]

## 2023-06-04 NOTE — Addendum Note (Signed)
Addended by: Lovena Neighbours on: 06/04/2023 09:52 AM   Modules accepted: Orders

## 2023-06-04 NOTE — Telephone Encounter (Signed)
Spoke with [employer].  They state that a prior authorization was required for the sleep study and one was not done so that is why the claim was denied.  They are stating that whoever scheduled the sleep study will need to contact the claim department and dispute the claim.  Will forward message for pulmonology to dispute claim.  Phone number for [facility] is # [phone].

## 2023-06-11 ENCOUNTER — Other Ambulatory Visit: Payer: Self-pay | Admitting: Family Medicine

## 2023-06-11 NOTE — Telephone Encounter (Signed)
[place] initiated a retro auth on 06/04/23 w/ [employer] to cover the cost of the study.  At the time of scheduling the appointment, pt had MCR & [employer] was not shown as an active coverage.    I checked the [employer] portal & this request is still pending.

## 2023-07-24 DIAGNOSIS — G4733 Obstructive sleep apnea (adult) (pediatric): Secondary | ICD-10-CM | POA: Diagnosis not present

## 2023-07-31 NOTE — Progress Notes (Unsigned)
    Iley Breeden T. Muntaha Vermette, MD, CAQ Sports Medicine [facility] at [facility] [address] [place], [address]  Phone: [phone]  FAX: [phone]  Tyler Ramsey - 67 y.o. male  MRN [id number]  Date of Birth: [birth year]  Date: 08/01/2023  PCP: Hannah Beat, MD  Referral: Hannah Beat, MD  No chief complaint on file.  Subjective:   Tyler Ramsey is a 67 y.o. very pleasant male patient with There is no height or weight on file to calculate BMI. who presents with the following:  Patient presents with a question of possible restless leg syndrome and symptoms.  Last CBC Lab Results  Component Value Date   WBC 8.6 06/04/2023   HGB 16.7 06/04/2023   HCT 49.3 06/04/2023   MCV 93.8 06/04/2023   MCH 32.2 08/14/2012   RDW 12.7 06/04/2023   PLT 246.0 06/04/2023    There is no recent iron panel.    Review of Systems is noted in the HPI, as appropriate  Objective:   There were no vitals taken for this visit.  GEN: No acute distress; alert,appropriate. PULM: Breathing comfortably in no respiratory distress PSYCH: Normally interactive.   Laboratory and Imaging Data:  Assessment and Plan:   ***

## 2023-08-01 ENCOUNTER — Other Ambulatory Visit: Payer: Self-pay | Admitting: Family Medicine

## 2023-08-01 ENCOUNTER — Ambulatory Visit: Payer: HMO | Admitting: Family Medicine

## 2023-08-01 ENCOUNTER — Encounter: Payer: Self-pay | Admitting: Family Medicine

## 2023-08-01 VITALS — BP 140/72 | HR 75 | Temp 98.4°F | Ht 70.0 in

## 2023-08-01 DIAGNOSIS — R5383 Other fatigue: Secondary | ICD-10-CM | POA: Diagnosis not present

## 2023-08-01 DIAGNOSIS — B3789 Other sites of candidiasis: Secondary | ICD-10-CM

## 2023-08-01 DIAGNOSIS — G2581 Restless legs syndrome: Secondary | ICD-10-CM

## 2023-08-01 DIAGNOSIS — E291 Testicular hypofunction: Secondary | ICD-10-CM | POA: Diagnosis not present

## 2023-08-01 LAB — T4, FREE: Free T4: 0.72 ng/dL (ref 0.60–1.60)

## 2023-08-01 LAB — IBC + FERRITIN
Ferritin: 48.9 ng/mL (ref 22.0–322.0)
Iron: 108 ug/dL (ref 42–165)
Saturation Ratios: 27.2 % (ref 20.0–50.0)
TIBC: 397.6 ug/dL (ref 250.0–450.0)
Transferrin: 284 mg/dL (ref 212.0–360.0)

## 2023-08-01 LAB — TSH: TSH: 1.11 u[IU]/mL (ref 0.35–5.50)

## 2023-08-01 LAB — T3, FREE: T3, Free: 3.2 pg/mL (ref 2.3–4.2)

## 2023-08-01 MED ORDER — FLUCONAZOLE 150 MG PO TABS
150.0000 mg | ORAL_TABLET | Freq: Every day | ORAL | 0 refills | Status: DC
Start: 2023-08-01 — End: 2023-08-02

## 2023-08-01 MED ORDER — ROPINIROLE HCL 0.5 MG PO TABS: 0.5000 mg | ORAL_TABLET | Freq: Three times a day (TID) | ORAL | 3 refills | Status: DC

## 2023-08-03 ENCOUNTER — Telehealth: Payer: Self-pay

## 2023-08-03 NOTE — Telephone Encounter (Signed)
Patient called in wanted to see if Requip should have been for 90  for 30 days. Script was only for 30 and you have for tid. Also wanted to make sure he was to take diflucan 150 1 if no improvement in 7 days take another.

## 2023-08-03 NOTE — Telephone Encounter (Signed)
I called Tyler Ramsey and clarified the scripts.  Requip is at bedtime. Diflucan 1 each day.

## 2023-08-24 DIAGNOSIS — G4733 Obstructive sleep apnea (adult) (pediatric): Secondary | ICD-10-CM | POA: Diagnosis not present

## 2023-09-14 ENCOUNTER — Encounter: Payer: Self-pay | Admitting: Pharmacist

## 2023-09-14 NOTE — Progress Notes (Signed)
Pharmacy Quality Measure Review  This patient is appearing on a report for being at risk of failing the adherence measure for diabetes medications this calendar year.   Medication: metformin 500 mg tablet Last fill date: 04/16/23 for 90 day supply  Insurance report was not up to date. No action needed at this time.  Medication has been refilled for 90 day supply on 07/17/23.

## 2023-09-17 ENCOUNTER — Telehealth: Payer: Self-pay | Admitting: Family Medicine

## 2023-09-17 NOTE — Telephone Encounter (Signed)
Patient has refills available already at the pharmacy.  Spoke with [facility] and they will get it ready for him.

## 2023-09-17 NOTE — Telephone Encounter (Signed)
Prescription Request  09/17/2023  LOV: 08/01/2023  What is the name of the medication or equipment? metFORMIN (GLUCOPHAGE-XR) 500 MG 24 hr tablet   DULoxetine (CYMBALTA) 60 MG capsule   Have you contacted your pharmacy to request a refill? Yes   Which pharmacy would you like this sent to?  [id number] [place], [place] - [address] [place] [address] Phone: [phone] Fax: [phone]    Patient notified that their request is being sent to the clinical staff for review and that they should receive a response within 2 business days.   Please advise at [facility] [phone]

## 2023-09-23 DIAGNOSIS — G4733 Obstructive sleep apnea (adult) (pediatric): Secondary | ICD-10-CM | POA: Diagnosis not present

## 2023-10-22 LAB — HM DIABETES EYE EXAM

## 2023-10-24 DIAGNOSIS — G4733 Obstructive sleep apnea (adult) (pediatric): Secondary | ICD-10-CM | POA: Diagnosis not present

## 2023-10-27 ENCOUNTER — Other Ambulatory Visit: Payer: Self-pay | Admitting: Family Medicine

## 2023-11-06 ENCOUNTER — Other Ambulatory Visit: Payer: Self-pay | Admitting: Family Medicine

## 2023-11-24 DIAGNOSIS — G4733 Obstructive sleep apnea (adult) (pediatric): Secondary | ICD-10-CM | POA: Diagnosis not present

## 2023-12-13 ENCOUNTER — Ambulatory Visit: Payer: HMO

## 2023-12-22 DIAGNOSIS — G4733 Obstructive sleep apnea (adult) (pediatric): Secondary | ICD-10-CM | POA: Diagnosis not present

## 2024-01-04 ENCOUNTER — Telehealth: Payer: Self-pay | Admitting: *Deleted

## 2024-01-04 DIAGNOSIS — Z125 Encounter for screening for malignant neoplasm of prostate: Secondary | ICD-10-CM

## 2024-01-04 DIAGNOSIS — E119 Type 2 diabetes mellitus without complications: Secondary | ICD-10-CM

## 2024-01-04 DIAGNOSIS — E1169 Type 2 diabetes mellitus with other specified complication: Secondary | ICD-10-CM

## 2024-01-04 DIAGNOSIS — Z79899 Other long term (current) drug therapy: Secondary | ICD-10-CM

## 2024-01-04 NOTE — Telephone Encounter (Signed)
-----   Message from Alvina Chou sent at 01/04/2024  9:56 AM EDT ----- Regarding: Lab orders for MON, 3.31.25 Patient is scheduled for CPX labs, please order future labs, Thanks , Camelia Eng

## 2024-01-21 ENCOUNTER — Other Ambulatory Visit (INDEPENDENT_AMBULATORY_CARE_PROVIDER_SITE_OTHER): Payer: HMO

## 2024-01-21 DIAGNOSIS — Z79899 Other long term (current) drug therapy: Secondary | ICD-10-CM | POA: Diagnosis not present

## 2024-01-21 DIAGNOSIS — E1169 Type 2 diabetes mellitus with other specified complication: Secondary | ICD-10-CM | POA: Diagnosis not present

## 2024-01-21 DIAGNOSIS — Z125 Encounter for screening for malignant neoplasm of prostate: Secondary | ICD-10-CM

## 2024-01-21 DIAGNOSIS — E785 Hyperlipidemia, unspecified: Secondary | ICD-10-CM

## 2024-01-21 DIAGNOSIS — E119 Type 2 diabetes mellitus without complications: Secondary | ICD-10-CM

## 2024-01-21 LAB — HEPATIC FUNCTION PANEL
ALT: 29 U/L (ref 0–53)
AST: 29 U/L (ref 0–37)
Albumin: 4.8 g/dL (ref 3.5–5.2)
Alkaline Phosphatase: 86 U/L (ref 39–117)
Bilirubin, Direct: 0.2 mg/dL (ref 0.0–0.3)
Total Bilirubin: 0.9 mg/dL (ref 0.2–1.2)
Total Protein: 8.2 g/dL (ref 6.0–8.3)

## 2024-01-21 LAB — CBC WITH DIFFERENTIAL/PLATELET
Basophils Absolute: 0.1 10*3/uL (ref 0.0–0.1)
Basophils Relative: 0.6 % (ref 0.0–3.0)
Eosinophils Absolute: 0.7 10*3/uL (ref 0.0–0.7)
Eosinophils Relative: 6.5 % — ABNORMAL HIGH (ref 0.0–5.0)
HCT: 53.5 % — ABNORMAL HIGH (ref 39.0–52.0)
Hemoglobin: 17.9 g/dL — ABNORMAL HIGH (ref 13.0–17.0)
Lymphocytes Relative: 23.9 % (ref 12.0–46.0)
Lymphs Abs: 2.6 10*3/uL (ref 0.7–4.0)
MCHC: 33.6 g/dL (ref 30.0–36.0)
MCV: 93 fl (ref 78.0–100.0)
Monocytes Absolute: 0.7 10*3/uL (ref 0.1–1.0)
Monocytes Relative: 6.8 % (ref 3.0–12.0)
Neutro Abs: 6.9 10*3/uL (ref 1.4–7.7)
Neutrophils Relative %: 62.2 % (ref 43.0–77.0)
Platelets: 313 10*3/uL (ref 150.0–400.0)
RBC: 5.75 Mil/uL (ref 4.22–5.81)
RDW: 13.2 % (ref 11.5–15.5)
WBC: 11.1 10*3/uL — ABNORMAL HIGH (ref 4.0–10.5)

## 2024-01-21 LAB — HEMOGLOBIN A1C: Hgb A1c MFr Bld: 6.8 % — ABNORMAL HIGH (ref 4.6–6.5)

## 2024-01-21 LAB — MICROALBUMIN / CREATININE URINE RATIO
Creatinine,U: 185.4 mg/dL
Microalb Creat Ratio: 52.2 mg/g — ABNORMAL HIGH (ref 0.0–30.0)
Microalb, Ur: 9.7 mg/dL — ABNORMAL HIGH (ref 0.0–1.9)

## 2024-01-21 LAB — LIPID PANEL
Cholesterol: 143 mg/dL (ref 0–200)
HDL: 46.7 mg/dL (ref >39.00)
LDL Cholesterol: 61 mg/dL (ref 0–99)
NonHDL: 96.18
Total CHOL/HDL Ratio: 3
Triglycerides: 175 mg/dL — ABNORMAL HIGH (ref 0.0–149.0)
VLDL: 35 mg/dL (ref 0.0–40.0)

## 2024-01-21 LAB — BASIC METABOLIC PANEL WITH GFR
BUN: 17 mg/dL (ref 6–23)
CO2: 30 meq/L (ref 19–32)
Calcium: 9.9 mg/dL (ref 8.4–10.5)
Chloride: 96 meq/L (ref 96–112)
Creatinine, Ser: 1.26 mg/dL (ref 0.40–1.50)
GFR: 58.49 mL/min — ABNORMAL LOW (ref >60.00)
Glucose, Bld: 137 mg/dL — ABNORMAL HIGH (ref 70–99)
Potassium: 4 meq/L (ref 3.5–5.1)
Sodium: 139 meq/L (ref 135–145)

## 2024-01-21 LAB — PSA, MEDICARE: PSA: 0.69 ng/mL (ref 0.10–4.00)

## 2024-01-22 DIAGNOSIS — G4733 Obstructive sleep apnea (adult) (pediatric): Secondary | ICD-10-CM | POA: Diagnosis not present

## 2024-01-25 ENCOUNTER — Other Ambulatory Visit: Payer: Self-pay | Admitting: Family Medicine

## 2024-01-27 NOTE — Progress Notes (Unsigned)
 Larae Caison T. Maite Burlison, MD, CAQ Sports Medicine [facility] [address] [place], [address]  Phone: [phone]  FAX: [phone]  Tyler Ramsey - 68 y.o. male  MRN [id number]  Date of Birth: [birth year]  Date: 01/28/2024  PCP: Hannah Beat, MD  Referral: Hannah Beat, MD  No chief complaint on file.  Patient Care Team: Hannah Beat, MD as PCP - General Lew Dawes Sheria Lang, OD (Optometry) Subjective:   Tyler Ramsey is a 68 y.o. pleasant patient who presents for a medicare wellness examination:  Preventative Health Maintenance Visit:  Health Maintenance Summary Reviewed and updated, unless pt declines services.  Tobacco History Reviewed. Alcohol: No concerns, no excessive use Exercise Habits: Some activity, rec at least 30 mins 5 times a week STD concerns: no risk or activity to increase risk Drug Use: None  Tyler Ramsey is a very nice patient, who I have known for many years.  Prevnar 20 Shingrix No. 2 COVID booster Tetanus booster RSV Eye exam  Diabetes Mellitus: Tolerating Medications: yes Compliance with diet: fair, There is no height or weight on file to calculate BMI. Exercise: minimal / intermittent Avg blood sugars at home: not checking Foot problems: none Hypoglycemia: none No nausea, vomitting, blurred vision, polyuria.  Lab Results  Component Value Date   HGBA1C 6.8 (H) 01/21/2024   HGBA1C 7.2 (A) 04/23/2023   HGBA1C 8.2 (H) 01/18/2023   Lab Results  Component Value Date   MICROALBUR 9.7 (H) 01/21/2024   LDLCALC 61 01/21/2024   CREATININE 1.26 01/21/2024    Wt Readings from Last 3 Encounters:  06/04/23 256 lb 4 oz (116.2 kg)  04/23/23 261 lb (118.4 kg)  04/23/23 260 lb 6 oz (118.1 kg)    HTN: Tolerating all medications without side effects Stable and at goal No CP, no sob. No HA.  BP Readings from Last 3 Encounters:  08/01/23 (!) 140/72  06/04/23 138/75  04/23/23 (!) 162/90     Basic Metabolic Panel:    Component Value Date/Time   NA 139 01/21/2024 0918   NA 139 08/14/2012 1044   K 4.0 01/21/2024 0918   K 4.2 08/14/2012 1044   CL 96 01/21/2024 0918   CL 102 08/14/2012 1044   CO2 30 01/21/2024 0918   CO2 29 08/14/2012 1044   BUN 17 01/21/2024 0918   BUN 15 08/14/2012 1044   CREATININE 1.26 01/21/2024 0918   CREATININE 0.83 08/14/2012 1044   GLUCOSE 137 (H) 01/21/2024 0918   GLUCOSE 79 08/14/2012 1044   CALCIUM 9.9 01/21/2024 0918   CALCIUM 9.1 08/14/2012 1044      Health Maintenance  Topic Date Due   Pneumonia Vaccine 32+ Years old (3 of 3 - PPSV23 or PCV20) 02/16/2021   Zoster Vaccines- Shingrix (2 of 2) 12/04/2021   COVID-19 Vaccine (4 - 2024-25 season) 06/24/2023   OPHTHALMOLOGY EXAM  10/10/2023   DTaP/Tdap/Td (2 - Td or Tdap) 10/18/2023   Medicare Annual Wellness (AWV)  01/24/2024   FOOT EXAM  01/24/2024   INFLUENZA VACCINE  05/23/2024   HEMOGLOBIN A1C  07/22/2024   Diabetic kidney evaluation - eGFR measurement  01/20/2025   Diabetic kidney evaluation - Urine ACR  01/20/2025   Colonoscopy  03/29/2033   Hepatitis C Screening  Completed   HPV VACCINES  Aged Out    Immunization History  Administered Date(s) Administered   Influenza Split 08/12/2012   Influenza-Unspecified 10/23/2016   PFIZER(Purple Top)SARS-COV-2 Vaccination 02/05/2020, 03/09/2020, 10/17/2020  Pneumococcal Conjugate-13 12/22/2020   Pneumococcal Polysaccharide-23 12/01/2015   Tdap 10/17/2013   Zoster Recombinant(Shingrix) 10/09/2021   Zoster, Live 12/01/2015    Patient Active Problem List   Diagnosis Date Noted   Controlled type 2 diabetes mellitus without complication, without long-term current use of insulin (HCC) 10/29/2008    Priority: High   Essential hypertension 09/24/2008    Priority: High   Elevated LFTs 01/24/2023    Priority: Medium    GAD (generalized anxiety disorder) 01/24/2023    Priority: Medium    Major depressive disorder, recurrent  episode, moderate (HCC) 12/03/2015    Priority: Medium    Sleep apnea 09/24/2008    Priority: Medium    Alcohol abuse 01/09/2022   Bilateral carpal tunnel syndrome 10/04/2020   Peripheral neuropathy 10/04/2020   Bariatric surgery status 08/04/2020   Hypogonadism in male 11/29/2014   Allergic rhinitis 09/24/2008    Past Medical History:  Diagnosis Date   Allergic rhinitis    Depression    Diabetes mellitus    Hyperlipidemia    Hypertension    Hypogonadism in male 11/29/2014   Sleep apnea     Past Surgical History:  Procedure Laterality Date   COLONOSCOPY WITH PROPOFOL N/A 03/30/2023   Procedure: COLONOSCOPY WITH PROPOFOL;  Surgeon: Wyline Mood, MD;  Location: [facility] ENDOSCOPY;  Service: Gastroenterology;  Laterality: N/A;   KNEE ARTHROSCOPY W/ ACL RECONSTRUCTION AND HAMSTRING GRAFT  1985   TONSILLECTOMY  1969   TOTAL KNEE ARTHROPLASTY  5/11   Left, Dr Carma Lair    Family History  Problem Relation Age of Onset   Emphysema Father    Heart disease Mother     Social History   Social History Narrative   Lived in [place], moved to [place] and plans to settle in [place].   Lives with significant other.   Right-handed.   No daily use of caffeine.    Past Medical History, Surgical History, Social History, Family History, Problem List, Medications, and Allergies have been reviewed and updated if relevant.  Review of Systems: Pertinent positives are listed above.  Otherwise, a full 14 point review of systems has been done in full and it is negative except where it is noted positive.  Objective:   There were no vitals taken for this visit.    01/09/2022   11:47 AM 01/24/2023   10:25 AM 08/01/2023    1:17 PM  Fall Risk  Falls in the past year? 0 0 1  Was there an injury with Fall?  0 0  Fall Risk Category Calculator  0 1  (RETIRED) Patient Fall Risk Level Low fall risk    Patient at Risk for Falls Due to  No Fall Risks History of fall(s)  Fall risk Follow up   Falls evaluation completed Falls evaluation completed   Ideal Body Weight:   No results found.    08/01/2023    1:18 PM 06/04/2023    9:20 AM 01/24/2023   10:25 AM 12/22/2020    9:08 AM 12/30/2018    8:31 AM  Depression screen PHQ 2/9  Decreased Interest 0 0 0 0 0  Down, Depressed, Hopeless 1 1 0 0 0  PHQ - 2 Score 1 1 0 0 0  Altered sleeping 2 0     Tired, decreased energy 2 1     Change in appetite 0 0     Feeling bad or failure about yourself  0 0     Trouble concentrating 0 0  Moving slowly or fidgety/restless 0 0     Suicidal thoughts 0 0     PHQ-9 Score 5 2     Difficult doing work/chores Somewhat difficult Not difficult at all        GEN: well developed, well nourished, no acute distress Eyes: conjunctiva and lids normal, PERRLA, EOMI ENT: TM clear, nares clear, oral exam WNL Neck: supple, no lymphadenopathy, no thyromegaly, no JVD Pulm: clear to auscultation and percussion, respiratory effort normal CV: regular rate and rhythm, S1-S2, no murmur, rub or gallop, no bruits, peripheral pulses normal and symmetric, no cyanosis, clubbing, edema or varicosities GI: soft, non-tender; no hepatosplenomegaly, masses; active bowel sounds all quadrants GU: deferred Lymph: no cervical, axillary or inguinal adenopathy MSK: gait normal, muscle tone and strength WNL, no joint swelling, effusions, discoloration, crepitus  SKIN: clear, good turgor, color WNL, no rashes, lesions, or ulcerations Neuro: normal mental status, normal strength, sensation, and motion Psych: alert; oriented to person, place and time, normally interactive and not anxious or depressed in appearance.  All labs reviewed with patient.  Results for orders placed or performed in visit on 01/21/24  PSA, Medicare   Collection Time: 01/21/24  9:18 AM  Result Value Ref Range   PSA 0.69 0.10 - 4.00 ng/ml  Hepatic Function Panel   Collection Time: 01/21/24  9:18 AM  Result Value Ref Range   Total Bilirubin 0.9 0.2 -  1.2 mg/dL   Bilirubin, Direct 0.2 0.0 - 0.3 mg/dL   Alkaline Phosphatase 86 39 - 117 U/L   AST 29 0 - 37 U/L   ALT 29 0 - 53 U/L   Total Protein 8.2 6.0 - 8.3 g/dL   Albumin 4.8 3.5 - 5.2 g/dL  Basic metabolic panel   Collection Time: 01/21/24  9:18 AM  Result Value Ref Range   Sodium 139 135 - 145 mEq/L   Potassium 4.0 3.5 - 5.1 mEq/L   Chloride 96 96 - 112 mEq/L   CO2 30 19 - 32 mEq/L   Glucose, Bld 137 (H) 70 - 99 mg/dL   BUN 17 6 - 23 mg/dL   Creatinine, Ser 0.98 0.40 - 1.50 mg/dL   GFR 11.91 (L) >47.82 mL/min   Calcium 9.9 8.4 - 10.5 mg/dL  CBC with Differential/Platelet   Collection Time: 01/21/24  9:18 AM  Result Value Ref Range   WBC 11.1 (H) 4.0 - 10.5 K/uL   RBC 5.75 4.22 - 5.81 Mil/uL   Hemoglobin 17.9 (H) 13.0 - 17.0 g/dL   HCT 95.6 (H) 21.3 - 08.6 %   MCV 93.0 78.0 - 100.0 fl   MCHC 33.6 30.0 - 36.0 g/dL   RDW 57.8 46.9 - 62.9 %   Platelets 313.0 150.0 - 400.0 K/uL   Neutrophils Relative % 62.2 43.0 - 77.0 %   Lymphocytes Relative 23.9 12.0 - 46.0 %   Monocytes Relative 6.8 3.0 - 12.0 %   Eosinophils Relative 6.5 (H) 0.0 - 5.0 %   Basophils Relative 0.6 0.0 - 3.0 %   Neutro Abs 6.9 1.4 - 7.7 K/uL   Lymphs Abs 2.6 0.7 - 4.0 K/uL   Monocytes Absolute 0.7 0.1 - 1.0 K/uL   Eosinophils Absolute 0.7 0.0 - 0.7 K/uL   Basophils Absolute 0.1 0.0 - 0.1 K/uL  Microalbumin / creatinine urine ratio   Collection Time: 01/21/24  9:18 AM  Result Value Ref Range   Microalb, Ur 9.7 (H) 0.0 - 1.9 mg/dL   Creatinine,U 528.4 mg/dL  Microalb Creat Ratio 52.2 (H) 0.0 - 30.0 mg/g  Lipid panel   Collection Time: 01/21/24  9:18 AM  Result Value Ref Range   Cholesterol 143 0 - 200 mg/dL   Triglycerides 244.0 (H) 0.0 - 149.0 mg/dL   HDL 10.27 >25.36 mg/dL   VLDL 64.4 0.0 - 03.4 mg/dL   LDL Cholesterol 61 0 - 99 mg/dL   Total CHOL/HDL Ratio 3    NonHDL 96.18   Hemoglobin A1c   Collection Time: 01/21/24  9:18 AM  Result Value Ref Range   Hgb A1c MFr Bld 6.8 (H) 4.6 - 6.5 %     Assessment and Plan:     ICD-10-CM   1. Healthcare maintenance  Z00.00       Health Maintenance Exam: The patient's preventative maintenance and recommended screening tests for an annual wellness exam were reviewed in full today. Brought up to date unless services declined.  Counselled on the importance of diet, exercise, and its role in overall health and mortality. The patient's FH and SH was reviewed, including their home life, tobacco status, and drug and alcohol status.  Follow-up in 1 year for physical exam or additional follow-up below.  I have personally reviewed the Medicare Annual Wellness questionnaire and have noted 1. The patient's medical and social history 2. Their use of alcohol, tobacco or illicit drugs 3. Their current medications and supplements 4. The patient's functional ability including ADL's, fall risks, home safety risks and hearing or visual             impairment. 5. Diet and physical activities 6. Evidence for depression or mood disorders 7. Reviewed Updated provider list, see scanned forms and [facility] Snapshot.  8. Reviewed whether or not the patient has HCPOA or living will, and discussed what this means with the patient.  Recommended he bring in a copy for his chart in [facility].  The patients weight, height, BMI and visual acuity have been recorded in the chart I have made referrals, counseling and provided education to the patient based review of the above and I have provided the pt with a written personalized care plan for preventive services.  I have provided the patient with a copy of your personalized plan for preventive services. Instructed to take the time to review along with their updated medication list.  Disposition: No follow-ups on file.  Future Appointments  Date Time Provider Department Center  01/28/2024 11:20 AM Alailah Safley, Karleen Hampshire, MD [facility]  02/29/2024 10:10 AM [facility] ANNUAL WELLNESS VISIT 1 [facility]    No orders of the  defined types were placed in this encounter.  There are no discontinued medications. No orders of the defined types were placed in this encounter.   Signed,  Elpidio Galea. Kabao Leite, MD   Allergies as of 01/28/2024       Reactions   Sulfonamide Derivatives    Morphine And Codeine Rash        Medication List        Accurate as of January 27, 2024  2:13 PM. If you have any questions, ask your nurse or doctor.          aspirin 81 MG tablet Take 81 mg by mouth daily.   busPIRone 10 MG tablet Commonly known as: BUSPAR TAKE 1 TABLET BY MOUTH TWICE A DAY   citalopram 40 MG tablet Commonly known as: CELEXA Take 1 tablet (40 mg total) by mouth daily.   DULoxetine 60 MG capsule Commonly known as: CYMBALTA Take  1 capsule (60 mg total) by mouth daily.   fluconazole 150 MG tablet Commonly known as: DIFLUCAN Take 1 tablet (150 mg total) by mouth daily.   hydrochlorothiazide 25 MG tablet Commonly known as: HYDRODIURIL TAKE 1 TABLET (25 MG TOTAL) BY MOUTH DAILY.   metFORMIN 500 MG 24 hr tablet Commonly known as: GLUCOPHAGE-XR TAKE 2 TABLETS BY MOUTH EVERY DAY WITH BREAKFAST   rOPINIRole 0.5 MG tablet Commonly known as: REQUIP Take 1 tablet (0.5 mg total) by mouth 3 (three) times daily.   simvastatin 40 MG tablet Commonly known as: ZOCOR TAKE 1 TABLET BY MOUTH EVERY DAY   triamcinolone ointment 0.1 % Commonly known as: KENALOG Apply 1 application. topically 2 (two) times daily.   zolpidem 10 MG tablet Commonly known as: AMBIEN Take 1 tablet (10 mg total) by mouth at bedtime as needed. for sleep

## 2024-01-28 ENCOUNTER — Ambulatory Visit (INDEPENDENT_AMBULATORY_CARE_PROVIDER_SITE_OTHER): Payer: HMO | Admitting: Family Medicine

## 2024-01-28 ENCOUNTER — Encounter: Payer: Self-pay | Admitting: Family Medicine

## 2024-01-28 VITALS — BP 138/80 | HR 76 | Temp 97.3°F | Ht 69.5 in | Wt 241.0 lb

## 2024-01-28 DIAGNOSIS — R809 Proteinuria, unspecified: Secondary | ICD-10-CM | POA: Diagnosis not present

## 2024-01-28 DIAGNOSIS — E1129 Type 2 diabetes mellitus with other diabetic kidney complication: Secondary | ICD-10-CM

## 2024-01-28 DIAGNOSIS — Z0000 Encounter for general adult medical examination without abnormal findings: Secondary | ICD-10-CM | POA: Diagnosis not present

## 2024-01-28 NOTE — Patient Instructions (Signed)
 Stop the hydrochlorothiazide.

## 2024-01-30 ENCOUNTER — Encounter: Payer: Self-pay | Admitting: Family Medicine

## 2024-01-30 MED ORDER — ZOLPIDEM TARTRATE 10 MG PO TABS: 10.0000 mg | ORAL_TABLET | Freq: Every evening | ORAL | 5 refills | Status: DC | PRN

## 2024-01-30 MED ORDER — ROPINIROLE HCL 0.5 MG PO TABS: 0.5000 mg | ORAL_TABLET | Freq: Every day | ORAL | 3 refills | Status: AC

## 2024-01-30 MED ORDER — ZOLPIDEM TARTRATE 10 MG PO TABS: 10.0000 mg | ORAL_TABLET | Freq: Every evening | ORAL | 1 refills | Status: DC | PRN

## 2024-01-30 MED ORDER — LOSARTAN POTASSIUM 50 MG PO TABS: 50.0000 mg | ORAL_TABLET | Freq: Every day | ORAL | 3 refills | Status: AC

## 2024-01-30 NOTE — Telephone Encounter (Addendum)
 I do not see where any new medication was sent to pharmacy on Monday.  Please advise.    Last office visit 01/28/2024 for CPE.  Last refilled on zolpidem 04/23/2023 for #30 with 5 refills.  Next Appt: 07/30/2024 for DM/HTN

## 2024-01-31 DIAGNOSIS — E1129 Type 2 diabetes mellitus with other diabetic kidney complication: Secondary | ICD-10-CM | POA: Insufficient documentation

## 2024-02-21 DIAGNOSIS — G4733 Obstructive sleep apnea (adult) (pediatric): Secondary | ICD-10-CM | POA: Diagnosis not present

## 2024-02-25 ENCOUNTER — Encounter: Payer: Self-pay | Admitting: Family Medicine

## 2024-02-25 ENCOUNTER — Ambulatory Visit (INDEPENDENT_AMBULATORY_CARE_PROVIDER_SITE_OTHER): Admitting: Family Medicine

## 2024-02-25 VITALS — BP 138/80 | HR 68 | Temp 98.0°F | Ht 69.5 in | Wt 246.1 lb

## 2024-02-25 DIAGNOSIS — L237 Allergic contact dermatitis due to plants, except food: Secondary | ICD-10-CM | POA: Insufficient documentation

## 2024-02-25 MED ORDER — PREDNISONE 10 MG PO TABS: ORAL_TABLET | ORAL | 0 refills | Status: DC

## 2024-02-25 NOTE — Assessment & Plan Note (Signed)
 Pt was exposed to irritant plant yesterday= has skin changes around left eye / some eye itching and left arm in pt very allergic to the plant  No vision changes/photophobia , mild conj injection (mostly swelling and erythema of upper and lower lid)  Prednisone  prescription given 30 mg taper/discussed side effects including elevated glucose  Cool compress Patanol or saline eye drops if needed  Watch for worse swelling/ any change in vision or eye itself  Update if not starting to improve in a week or if worsening  Call back and Er precautions noted in detail today    Also advised to clean any items that touched the plant

## 2024-02-25 NOTE — Progress Notes (Signed)
 Subjective:    Patient ID: Tyler Ramsey, male    DOB: 12-Feb-1956, 68 y.o.   MRN: [id number]  HPI  Wt Readings from Last 3 Encounters:  02/25/24 246 lb 2 oz (111.6 kg)  01/28/24 241 lb (109.3 kg)  06/04/23 256 lb 4 oz (116.2 kg)   35.83 kg/m  Vitals:   02/25/24 1124 02/25/24 1140  BP: (!) 150/80 138/80  Pulse: 68   Temp: 98 F (36.7 C)   SpO2: 97%     68 yo pt of Dr Geralyn Knee presents with c/o poison ivy dermatitis  Also involves eye   Was exposed to possible irritant plant yesterday working outside   Was not wearing gloves  Thinks he wiped his eye   Left eye lid is swollen and red  Itchy Not painful  Eye does not water a lot   No vision symptoms No light sensitivity     Used some patanol eye drops    Allergic to poison ivy and other plants     Patient Active Problem List   Diagnosis Date Noted   Plant allergic contact dermatitis 02/25/2024   Microalbuminuria due to type 2 diabetes mellitus (HCC) 01/31/2024   Elevated LFTs 01/24/2023   GAD (generalized anxiety disorder) 01/24/2023   Alcohol abuse 01/09/2022   Bilateral carpal tunnel syndrome 10/04/2020   Peripheral neuropathy 10/04/2020   Bariatric surgery status 08/04/2020   Major depressive disorder, recurrent episode, moderate (HCC) 12/03/2015   Hypogonadism in male 11/29/2014   Controlled type 2 diabetes mellitus without complication, without long-term current use of insulin (HCC) 10/29/2008   Essential hypertension 09/24/2008   Allergic rhinitis 09/24/2008   Sleep apnea 09/24/2008   Past Medical History:  Diagnosis Date   Allergic rhinitis    Depression    Diabetes mellitus    Hyperlipidemia    Hypertension    Hypogonadism in male 11/29/2014   Sleep apnea    Past Surgical History:  Procedure Laterality Date   COLONOSCOPY WITH PROPOFOL  N/A 03/30/2023   Procedure: COLONOSCOPY WITH PROPOFOL ;  Surgeon: Luke Salaam, MD;  Location: [facility] ENDOSCOPY;  Service: Gastroenterology;  Laterality:  N/A;   KNEE ARTHROSCOPY W/ ACL RECONSTRUCTION AND HAMSTRING GRAFT  1985   TONSILLECTOMY  1969   TOTAL KNEE ARTHROPLASTY  5/11   Left, Dr Armand Lamy   Social History   Tobacco Use   Smoking status: Never   Smokeless tobacco: Never  Vaping Use   Vaping status: Never Used  Substance Use Topics   Alcohol use: Yes    Comment: occasional   Drug use: No   Family History  Problem Relation Age of Onset   Emphysema Father    Heart disease Mother    Allergies  Allergen Reactions   Grass Pollen(K-O-R-T-Swt Vern) Swelling   Sulfonamide Derivatives    Morphine And Codeine Rash   Current Outpatient Medications on File Prior to Visit  Medication Sig Dispense Refill   aspirin 81 MG tablet Take 81 mg by mouth daily.     busPIRone  (BUSPAR ) 10 MG tablet TAKE 1 TABLET BY MOUTH TWICE A DAY 180 tablet 0   citalopram  (CELEXA ) 40 MG tablet Take 1 tablet (40 mg total) by mouth daily. 90 tablet 3   DULoxetine  (CYMBALTA ) 60 MG capsule Take 1 capsule (60 mg total) by mouth daily. 90 capsule 3   losartan  (COZAAR ) 50 MG tablet Take 1 tablet (50 mg total) by mouth daily. 90 tablet 3   metFORMIN  (GLUCOPHAGE -XR) 500 MG 24 hr tablet  TAKE 2 TABLETS BY MOUTH EVERY DAY WITH BREAKFAST 180 tablet 1   rOPINIRole  (REQUIP ) 0.5 MG tablet Take 0.5 mg by mouth at bedtime.     rOPINIRole  (REQUIP ) 0.5 MG tablet Take 1 tablet (0.5 mg total) by mouth at bedtime. 90 tablet 3   simvastatin  (ZOCOR ) 40 MG tablet TAKE 1 TABLET BY MOUTH EVERY DAY 90 tablet 0   triamcinolone  ointment (KENALOG ) 0.1 % Apply 1 application. topically 2 (two) times daily. 30 g 1   zolpidem  (AMBIEN ) 10 MG tablet Take 1 tablet (10 mg total) by mouth at bedtime as needed. for sleep 90 tablet 1   zolpidem  (AMBIEN ) 10 MG tablet Take 1 tablet (10 mg total) by mouth at bedtime as needed. for sleep 30 tablet 5   No current facility-administered medications on file prior to visit.    Review of Systems  Constitutional:  Negative for activity change,  appetite change, fatigue, fever and unexpected weight change.  HENT:  Negative for congestion, rhinorrhea, sore throat and trouble swallowing.   Eyes:  Positive for itching. Negative for pain, discharge, redness and visual disturbance.       Swollen left eye lids upper and lower   Respiratory:  Negative for cough, chest tightness, shortness of breath and wheezing.   Cardiovascular:  Negative for chest pain and palpitations.  Gastrointestinal:  Negative for abdominal pain, blood in stool, constipation, diarrhea and nausea.  Endocrine: Negative for cold intolerance, heat intolerance, polydipsia and polyuria.  Genitourinary:  Negative for difficulty urinating, dysuria, frequency and urgency.  Musculoskeletal:  Negative for arthralgias, joint swelling and myalgias.  Skin:  Positive for rash. Negative for pallor.  Neurological:  Negative for dizziness, tremors, weakness, numbness and headaches.  Hematological:  Negative for adenopathy. Does not bruise/bleed easily.  Psychiatric/Behavioral:  Negative for decreased concentration and dysphoric mood. The patient is not nervous/anxious.        Objective:   Physical Exam Constitutional:      General: He is not in acute distress.    Appearance: Normal appearance. He is obese. He is not ill-appearing.  HENT:     Head: Normocephalic and atraumatic.     Mouth/Throat:     Mouth: Mucous membranes are moist.  Eyes:     General: No scleral icterus.       Right eye: No discharge.        Left eye: No discharge.     Extraocular Movements: Extraocular movements intact.     Pupils: Pupils are equal, round, and reactive to light.     Comments: Very mild conjunctival injection of left eye  No tearing or discharge   Cardiovascular:     Rate and Rhythm: Normal rate and regular rhythm.  Pulmonary:     Effort: Pulmonary effort is normal. No respiratory distress.     Breath sounds: No wheezing.  Skin:    General: Skin is dry.     Findings: Rash present.      Comments: Several vesicles in linear pattern on left arm resembling plant dermatitis   Erythema and mild swelling of left upper/lower lids  No open areas   Neurological:     Mental Status: He is alert.     Cranial Nerves: No cranial nerve deficit.  Psychiatric:        Mood and Affect: Mood normal.           Assessment & Plan:   Problem List Items Addressed This Visit       Musculoskeletal and  Integument   Plant allergic contact dermatitis - Primary   Pt was exposed to irritant plant yesterday= has skin changes around left eye / some eye itching and left arm in pt very allergic to the plant  No vision changes/photophobia , mild conj injection (mostly swelling and erythema of upper and lower lid)  Prednisone  prescription given 30 mg taper/discussed side effects including elevated glucose  Cool compress Patanol or saline eye drops if needed  Watch for worse swelling/ any change in vision or eye itself  Update if not starting to improve in a week or if worsening  Call back and Er precautions noted in detail today    Also advised to clean any items that touched the plant

## 2024-02-25 NOTE — Patient Instructions (Addendum)
 Wipe down or clean anything that came in contact with the plant oil   You can use the patanol eye drops or regular saline eye drops if needed   Take prednisone  as directed /it may make you feel hyper and hungry / it will also raise blood sugar temporarily    Cool compress on itchy spots as needed  Keep everything clean

## 2024-02-27 ENCOUNTER — Other Ambulatory Visit: Payer: Self-pay | Admitting: Family Medicine

## 2024-02-29 ENCOUNTER — Ambulatory Visit: Payer: HMO

## 2024-03-03 ENCOUNTER — Encounter: Payer: Self-pay | Admitting: Family Medicine

## 2024-03-03 ENCOUNTER — Ambulatory Visit: Payer: Self-pay

## 2024-03-03 ENCOUNTER — Ambulatory Visit (INDEPENDENT_AMBULATORY_CARE_PROVIDER_SITE_OTHER): Admitting: Family Medicine

## 2024-03-03 VITALS — BP 132/80 | HR 69 | Temp 97.5°F | Ht 69.5 in | Wt 243.4 lb

## 2024-03-03 DIAGNOSIS — L237 Allergic contact dermatitis due to plants, except food: Secondary | ICD-10-CM | POA: Diagnosis not present

## 2024-03-03 MED ORDER — PREDNISONE 20 MG PO TABS: ORAL_TABLET | ORAL | 0 refills | Status: DC

## 2024-03-03 NOTE — Progress Notes (Unsigned)
 Tyler Ramsey T. Tyler Wafer, MD, CAQ Sports Medicine Tyler Ramsey at [facility] [address] [place], [address]  Phone: [phone]  FAX: [phone]  Tyler Ramsey - 68 y.o. male  MRN [id number]  Date of Birth: [birth year]  Date: 03/03/2024  PCP: Tyler Curt, MD  Referral: Tyler Curt, MD  Chief Complaint  Patient presents with   Tyler Ramsey    Not Improving-Seen Dr Tyler Ramsey on 02/25/24   Subjective:   Tyler Ramsey is a 68 y.o. very pleasant male patient with Body mass index is 35.42 kg/m. who presents with the following:  Patient has some significant poison ivy.  He saw my partner Dr. Malissa Ramsey on Feb 25, 2024, and at this point he did have some mild improvement of his poison ivy, but it is still bothering him quite a bit and is relatively diffuse.  He was given some prednisone  30 mg x 3 days, 20 mg x 3 days, 10 mg x 3 days, and still has persistent symptoms.  Review of Systems is noted in the HPI, as appropriate  Objective:   BP 132/80   Pulse 69   Temp (!) 97.5 F (36.4 C) (Temporal)   Ht 5' 9.5" (1.765 m)   Wt 243 lb 6 oz (110.4 kg)   SpO2 100%   BMI 35.42 kg/m   GEN: No acute distress; alert,appropriate. PULM: Breathing comfortably in no respiratory distress PSYCH: Normally interactive.     Multiple areas of similar rash on the remainder of the extremities as well as the torso.  Laboratory and Imaging Data:  Assessment and Plan:     ICD-10-CM   1. Poison ivy  L23.7      I am going to increase his prednisone  dose and extend this.  I think that he should do fine.  Medication Management during today's office visit: Meds ordered this encounter  Medications   predniSONE  (DELTASONE ) 20 MG tablet    Sig: 2 tabs po daily for 5 days, then 1 tab po daily for 5 days    Dispense:  15 tablet    Refill:  0   Medications Discontinued During This Encounter  Medication Reason   rOPINIRole  (REQUIP ) 0.5 MG tablet Duplicate    zolpidem  (AMBIEN ) 10 MG tablet Duplicate   predniSONE  (DELTASONE ) 10 MG tablet Completed Course    Orders placed today for conditions managed today: No orders of the defined types were placed in this encounter.   Disposition: No follow-ups on file.  Dragon Medical One speech-to-text software was used for transcription in this dictation.  Possible transcriptional errors can occur using Animal nutritionist.   Signed,  Tyler Bye. Klarisa Barman, MD   Outpatient Encounter Medications as of 03/03/2024  Medication Sig   aspirin 81 MG tablet Take 81 mg by mouth daily.   busPIRone  (BUSPAR ) 10 MG tablet TAKE 1 TABLET BY MOUTH TWICE A DAY   citalopram  (CELEXA ) 40 MG tablet Take 1 tablet (40 mg total) by mouth daily.   DULoxetine  (CYMBALTA ) 60 MG capsule Take 1 capsule (60 mg total) by mouth daily.   losartan  (COZAAR ) 50 MG tablet Take 1 tablet (50 mg total) by mouth daily.   metFORMIN  (GLUCOPHAGE -XR) 500 MG 24 hr tablet TAKE 2 TABLETS BY MOUTH EVERY DAY WITH BREAKFAST   predniSONE  (DELTASONE ) 20 MG tablet 2 tabs po daily for 5 days, then 1 tab po daily for 5 days   rOPINIRole  (REQUIP ) 0.5 MG tablet Take 1 tablet (0.5 mg total) by  mouth at bedtime.   simvastatin  (ZOCOR ) 40 MG tablet TAKE 1 TABLET BY MOUTH EVERY DAY   triamcinolone  ointment (KENALOG ) 0.1 % Apply 1 application. topically 2 (two) times daily.   zolpidem  (AMBIEN ) 10 MG tablet Take 1 tablet (10 mg total) by mouth at bedtime as needed. for sleep   [DISCONTINUED] predniSONE  (DELTASONE ) 10 MG tablet Take 3 pills once daily by mouth for 3 days, then 2 pills once daily for 3 days, then 1 pill once daily for 3 days and then stop   [DISCONTINUED] zolpidem  (AMBIEN ) 10 MG tablet Take 1 tablet (10 mg total) by mouth at bedtime as needed. for sleep   [DISCONTINUED] rOPINIRole  (REQUIP ) 0.5 MG tablet Take 0.5 mg by mouth at bedtime.   No facility-administered encounter medications on file as of 03/03/2024.

## 2024-03-03 NOTE — Telephone Encounter (Signed)
 Copied from CRM [id number]. Topic: Clinical - Red Word Triage >> Mar 03, 2024  8:06 AM Tyler Ramsey wrote: Reason for CRM: patient called stating he saw [facility] on Monday for poison ivy and he stated he was given prednisone  and it is not working.  Patient stated he was given this medication a week ago and is arms are still blistering. Patient stated his face is better   Chief Complaint: poison ivy not improving Symptoms: face rash improving, redness/blistering/itchiness to arms, legs, and chest, some blisters weeping, interrupted sleep Frequency: continual Pertinent Negatives: Patient denies SOB, chest pain, worsening poison ivy rash Disposition: [] 911 / [] ED /[] Urgent Care (no appt availability in office) / [x] Appointment(In office/virtual)/ []  [facility]/ [] Home Care/ [] Refused Recommended Disposition /[] [facility]/ []  Follow-up with PCP Additional Notes: Pt reporting that he was examined for poison ivy on 5/5 after exposure that weekend prior, prescribed prednisone , pt reporting that rash to face has improved but still redness and "30-40" blisters with some weeping of fluid across his arms, some on legs, and little bit on chest. Pt reporting that the itchiness is still waking him from sleep. Pt confirms no SOB or worsening of the rash. Advised pt be reexamined in next 4 hours, scheduled with PCP, advised call back if worsening. Pt verbalized understanding.  Reason for Disposition  Large blisters or oozing sores  Answer Assessment - Initial Assessment Questions 1. APPEARANCE of RASH: "Describe the rash."      Face is better don't look like ogre anymore but arms still red, whole upper body still itches, 30-40 blisters, red rashes, blisters are closed but certain amount of weeping 2. LOCATION: "Where is the rash located?"  (e.g., face, genitals, hands, legs)     Forearms both still red, some on legs too, a little bit on chest 4. ONSET: "When did the rash begin?"      Started a  week and a half ago 5. ITCHING: "Does the rash itch?" If Yes, ask: "How bad is it?"   - MILD - doesn't interfere with normal activities   - MODERATE-SEVERE: interferes with work, school, sleep, or other activities      Still waking him up from sleep 7. PAST HISTORY: "Have you had a poison ivy rash before?" If Yes, ask: "How bad was it?"      have had this 3-4x in life, this is the second worse case I've had since was a kid  Protocols used: Poison Ivy - [facility]

## 2024-03-17 ENCOUNTER — Other Ambulatory Visit: Payer: Self-pay | Admitting: Family Medicine

## 2024-03-18 ENCOUNTER — Telehealth: Payer: Self-pay | Admitting: Family Medicine

## 2024-03-18 NOTE — Telephone Encounter (Unsigned)
 Copied from CRM [id number]. Topic: Clinical - Medication Refill >> Mar 18, 2024 11:04 AM Jenice Mitts wrote: Medication:  simvastatin  (ZOCOR ) 40 MG tablet   Has the patient contacted their pharmacy? Yes (Agent: If no, request that the patient contact the pharmacy for the refill. If patient does not wish to contact the pharmacy document the reason why and proceed with request.) (Agent: If yes, when and what did the pharmacy advise?)  This is the patient's preferred pharmacy:  [phone] [place] - [address] [place] [address] Phone: [phone] Fax: [phone]    Is this the correct pharmacy for this prescription? Yes If no, delete pharmacy and type the correct one.   Has the prescription been filled recently? Yes  Is the patient out of the medication? Yes  Has the patient been seen for an appointment in the last year OR does the patient have an upcoming appointment? Yes  Can we respond through MyChart? Yes  Agent: Please be advised that Rx refills may take up to 3 business days. We ask that you follow-up with your pharmacy.

## 2024-03-19 MED ORDER — SIMVASTATIN 40 MG PO TABS: 40.0000 mg | ORAL_TABLET | Freq: Every day | ORAL | 3 refills | Status: AC

## 2024-03-19 NOTE — Telephone Encounter (Signed)
 Refills sent as requested

## 2024-04-03 DIAGNOSIS — G4733 Obstructive sleep apnea (adult) (pediatric): Secondary | ICD-10-CM | POA: Diagnosis not present

## 2024-04-16 ENCOUNTER — Other Ambulatory Visit: Payer: Self-pay | Admitting: Family Medicine

## 2024-04-20 ENCOUNTER — Other Ambulatory Visit: Payer: Self-pay | Admitting: Family Medicine

## 2024-05-03 DIAGNOSIS — G4733 Obstructive sleep apnea (adult) (pediatric): Secondary | ICD-10-CM | POA: Diagnosis not present

## 2024-05-09 ENCOUNTER — Ambulatory Visit: Admitting: Family Medicine

## 2024-05-11 NOTE — Progress Notes (Unsigned)
     Breslin Hemann T. Saramarie Stinger, MD, CAQ Sports Medicine [facility] at [facility] [address] [place], [address]  Phone: [phone]  FAX: [phone]  HELMUT HENNON - 68 y.o. male  MRN [id number]  Date of Birth: August 19, 1956  Date: 05/14/2024  PCP: Watt Mirza, MD  Referral: Watt Mirza, MD  No chief complaint on file.  Subjective:   Tyler Ramsey is a 68 y.o. very pleasant male patient with There is no height or weight on file to calculate BMI. who presents with the following:  Patient presents with ongoing right-sided knee pain.    Review of Systems is noted in the HPI, as appropriate  Objective:   There were no vitals taken for this visit.  GEN: No acute distress; alert,appropriate. PULM: Breathing comfortably in no respiratory distress PSYCH: Normally interactive.   Laboratory and Imaging Data:  Assessment and Plan:   ***

## 2024-05-14 ENCOUNTER — Encounter: Payer: Self-pay | Admitting: Family Medicine

## 2024-05-14 ENCOUNTER — Ambulatory Visit (INDEPENDENT_AMBULATORY_CARE_PROVIDER_SITE_OTHER)
Admission: RE | Admit: 2024-05-14 | Discharge: 2024-05-14 | Disposition: A | Discharge status: Home / Self Care | Source: Ambulatory Visit | Attending: Family Medicine | Admitting: Family Medicine

## 2024-05-14 ENCOUNTER — Ambulatory Visit: Payer: Self-pay | Admitting: Family Medicine

## 2024-05-14 ENCOUNTER — Ambulatory Visit (INDEPENDENT_AMBULATORY_CARE_PROVIDER_SITE_OTHER): Admitting: Family Medicine

## 2024-05-14 VITALS — BP 118/62 | HR 73 | Temp 97.8°F | Ht 69.5 in | Wt 258.5 lb

## 2024-05-14 DIAGNOSIS — M25571 Pain in right ankle and joints of right foot: Secondary | ICD-10-CM | POA: Diagnosis not present

## 2024-05-14 DIAGNOSIS — M25461 Effusion, right knee: Secondary | ICD-10-CM | POA: Diagnosis not present

## 2024-05-14 DIAGNOSIS — Z96652 Presence of left artificial knee joint: Secondary | ICD-10-CM | POA: Diagnosis not present

## 2024-05-14 DIAGNOSIS — M19071 Primary osteoarthritis, right ankle and foot: Secondary | ICD-10-CM | POA: Diagnosis not present

## 2024-05-14 DIAGNOSIS — M545 Low back pain, unspecified: Secondary | ICD-10-CM

## 2024-05-14 DIAGNOSIS — M1711 Unilateral primary osteoarthritis, right knee: Secondary | ICD-10-CM

## 2024-05-14 DIAGNOSIS — S99911A Unspecified injury of right ankle, initial encounter: Secondary | ICD-10-CM | POA: Diagnosis not present

## 2024-05-14 DIAGNOSIS — M25561 Pain in right knee: Secondary | ICD-10-CM

## 2024-05-14 MED ORDER — DICLOFENAC SODIUM 75 MG PO TBEC: 75.0000 mg | DELAYED_RELEASE_TABLET | Freq: Two times a day (BID) | ORAL | 1 refills | Status: DC

## 2024-05-14 MED ORDER — PREDNISONE 20 MG PO TABS: ORAL_TABLET | ORAL | 0 refills | Status: DC

## 2024-05-20 ENCOUNTER — Other Ambulatory Visit: Payer: Self-pay | Admitting: *Deleted

## 2024-05-20 ENCOUNTER — Telehealth: Payer: Self-pay | Admitting: Family Medicine

## 2024-05-20 MED ORDER — BUSPIRONE HCL 10 MG PO TABS: 10.0000 mg | ORAL_TABLET | Freq: Two times a day (BID) | ORAL | 1 refills | Status: DC

## 2024-05-20 NOTE — Telephone Encounter (Unsigned)
 Copied from CRM [id number]. Topic: Clinical - Medication Refill >> May 20, 2024  3:36 PM Chasity T wrote: Medication: busPIRone  (BUSPAR ) 10 MG tablet   Has the patient contacted their pharmacy? Yes   This is the patient's preferred pharmacy:  [phone], [place] - [address] [place] [place] [address] Phone: [phone] Fax: [phone]    Is this the correct pharmacy for this prescription? Yes If no, delete pharmacy and type the correct one.   Has the prescription been filled recently? No  Is the patient out of the medication? Yes  Has the patient been seen for an appointment in the last year OR does the patient have an upcoming appointment? Yes  Can we respond through MyChart? Yes  Agent: Please be advised that Rx refills may take up to 3 business days. We ask that you follow-up with your pharmacy.

## 2024-05-20 NOTE — Telephone Encounter (Signed)
 Refill sent as requested.

## 2024-05-20 NOTE — Telephone Encounter (Signed)
 Copied from CRM [id number]. Topic: Clinical - Medication Refill >> May 20, 2024  3:36 PM Chasity T wrote: Medication: busPIRone  (BUSPAR ) 10 MG tablet   Has the patient contacted their pharmacy? Yes   This is the patient's preferred pharmacy:  [phone] [place] - [address] [place] [address] Phone: [phone] Fax: [phone]    Is this the correct pharmacy for this prescription? Yes If no, delete pharmacy and type the correct one.   Has the prescription been filled recently? No  Is the patient out of the medication? Yes  Has the patient been seen for an appointment in the last year OR does the patient have an upcoming appointment? Yes  Can we respond through MyChart? Yes  Agent: Please be advised that Rx refills may take up to 3 business days. We ask that you follow-up with your pharmacy.

## 2024-06-03 DIAGNOSIS — G4733 Obstructive sleep apnea (adult) (pediatric): Secondary | ICD-10-CM | POA: Diagnosis not present

## 2024-07-04 DIAGNOSIS — G4733 Obstructive sleep apnea (adult) (pediatric): Secondary | ICD-10-CM | POA: Diagnosis not present

## 2024-07-09 ENCOUNTER — Other Ambulatory Visit: Payer: Self-pay | Admitting: Family Medicine

## 2024-07-09 NOTE — Telephone Encounter (Signed)
 Last office visit 05/14/2024 for right knee pain.  Last refilled 05/14/24 for #60 with 1 refill.  Next Appt: 07/30/24 for DM/HTN.

## 2024-07-17 ENCOUNTER — Other Ambulatory Visit: Payer: Self-pay | Admitting: Family Medicine

## 2024-07-21 ENCOUNTER — Other Ambulatory Visit: Payer: Self-pay | Admitting: Family Medicine

## 2024-07-21 NOTE — Telephone Encounter (Signed)
 Last office visit 05/14/24 for knee pain.  Last refilled 01/30/24 for #30 with 5 refills.  Next appt: 10/29/24 for DM/HTN.

## 2024-07-21 NOTE — Telephone Encounter (Unsigned)
 Copied from CRM [id number]. Topic: Clinical - Medication Refill >> Jul 21, 2024  4:29 PM Dedra B wrote: Medication: Pt requesting refill for  zolpidem  (AMBIEN ) 10 MG tablet  Has the patient contacted their pharmacy? No, says he's out of refills  This is the patient's preferred pharmacy:  [phone] [place] - [address] [place] [address] Phone: [phone] Fax: [phone]  Is this the correct pharmacy for this prescription? Yes  Has the prescription been filled recently? No  Is the patient out of the medication? Yes  Has the patient been seen for an appointment in the last year OR does the patient have an upcoming appointment? Yes  Can we respond through MyChart? Yes  Agent: Please be advised that Rx refills may take up to 3 business days. We ask that you follow-up with your pharmacy.

## 2024-07-22 MED ORDER — ZOLPIDEM TARTRATE 10 MG PO TABS: 10.0000 mg | ORAL_TABLET | Freq: Every evening | ORAL | 5 refills | Status: AC | PRN

## 2024-07-30 ENCOUNTER — Ambulatory Visit: Admitting: Family Medicine

## 2024-08-03 DIAGNOSIS — G4733 Obstructive sleep apnea (adult) (pediatric): Secondary | ICD-10-CM | POA: Diagnosis not present

## 2024-08-16 DIAGNOSIS — E113293 Type 2 diabetes mellitus with mild nonproliferative diabetic retinopathy without macular edema, bilateral: Secondary | ICD-10-CM | POA: Diagnosis not present

## 2024-08-16 DIAGNOSIS — H5211 Myopia, right eye: Secondary | ICD-10-CM | POA: Diagnosis not present

## 2024-08-16 LAB — OPHTHALMOLOGY REPORT-SCANNED

## 2024-09-07 ENCOUNTER — Other Ambulatory Visit: Payer: Self-pay | Admitting: Family Medicine

## 2024-09-30 ENCOUNTER — Telehealth: Payer: Self-pay

## 2024-09-30 NOTE — Telephone Encounter (Signed)
 Copied from CRM [id number]. Topic: Clinical - Prescription Issue >> Sep 30, 2024  4:17 PM Tyler Ramsey wrote: Reason for CRM: pt tried to refill citalopram  (CELEXA ) 40 MG tablet , the pharmacy adv there were no refiils. Its showing 1 refill on the bottle but they say there are no fills. Pt callback# is [phone]

## 2024-10-01 NOTE — Telephone Encounter (Signed)
 [facility]  Mr. Arocho does have a refill and they will get it ready for him.  Left message for Mr. Lamar advising him of this.

## 2024-10-03 ENCOUNTER — Other Ambulatory Visit: Payer: Self-pay | Admitting: Family Medicine

## 2024-10-05 ENCOUNTER — Other Ambulatory Visit: Payer: Self-pay | Admitting: Family Medicine

## 2024-10-06 NOTE — Telephone Encounter (Signed)
 Last office visit 05/14/2024 for Knee Pain.  Last refilled 07/09/2024 for #60 with 2 refills. Next Appt: 10/29/24 for DM/HTN.

## 2024-10-28 NOTE — Progress Notes (Unsigned)
 "    Erasto Sleight T. Melek Pownall, MD, CAQ Sports Medicine [facility] at [facility] [address] [place], [address]  Phone: [phone]  FAX: [phone]  Tyler Ramsey - 69 y.o. male  MRN [id number]  Date of Birth: 07-Oct-1956  Date: 10/29/2024  PCP: Watt Mirza, MD  Referral: Watt Mirza, MD  No chief complaint on file.  Subjective:   Tyler Ramsey is a 69 y.o. very pleasant male patient with There is no height or weight on file to calculate BMI. who presents with the following:  Discussed the use of AI scribe software for clinical note transcription with the patient, who gave verbal consent to proceed.  Patient presents for follow-up diabetes and hypertension.  Diabetes Mellitus: Tolerating Medications: yes Compliance with diet: fair, There is no height or weight on file to calculate BMI. Exercise: minimal / intermittent Avg blood sugars at home: not checking Foot problems: none Hypoglycemia: none No nausea, vomitting, blurred vision, polyuria.  Lab Results  Component Value Date   HGBA1C 6.8 (H) 01/21/2024   HGBA1C 7.2 (A) 04/23/2023   HGBA1C 8.2 (H) 01/18/2023   Lab Results  Component Value Date   MICROALBUR 9.7 (H) 01/21/2024   LDLCALC 61 01/21/2024   CREATININE 1.26 01/21/2024    Wt Readings from Last 3 Encounters:  05/14/24 258 lb 8 oz (117.3 kg)  03/03/24 243 lb 6 oz (110.4 kg)  02/25/24 246 lb 2 oz (111.6 kg)    HTN: Tolerating all medications without side effects Stable and at goal No CP, no sob. No HA.  BP Readings from Last 3 Encounters:  05/14/24 118/62  03/03/24 132/80  02/25/24 138/80    Basic Metabolic Panel:    Component Value Date/Time   NA 139 01/21/2024 0918   NA 139 08/14/2012 1044   K 4.0 01/21/2024 0918   K 4.2 08/14/2012 1044   CL 96 01/21/2024 0918   CL 102 08/14/2012 1044   CO2 30 01/21/2024 0918   CO2 29 08/14/2012 1044   BUN 17 01/21/2024 0918   BUN 15 08/14/2012 1044   CREATININE  1.26 01/21/2024 0918   CREATININE 0.83 08/14/2012 1044   GLUCOSE 137 (H) 01/21/2024 0918   GLUCOSE 79 08/14/2012 1044   CALCIUM 9.9 01/21/2024 0918   CALCIUM 9.1 08/14/2012 1044    History of Present Illness     Review of Systems is noted in the HPI, as appropriate  Objective:   There were no vitals taken for this visit.  GEN: No acute distress; alert,appropriate. PULM: Breathing comfortably in no respiratory distress PSYCH: Normally interactive.   Laboratory and Imaging Data:  Assessment and Plan:     ICD-10-CM   1. Controlled type 2 diabetes mellitus without complication, without long-term current use of insulin (HCC)  E11.9     2. Essential hypertension  I10      Assessment & Plan   Medication Management during today's office visit: No orders of the defined types were placed in this encounter.  There are no discontinued medications.  Orders placed today for conditions managed today: No orders of the defined types were placed in this encounter.   Disposition: No follow-ups on file.  Dragon Medical One speech-to-text software was used for transcription in this dictation.  Possible transcriptional errors can occur using Animal nutritionist.   Signed,  Mirza DASEN. Keiarra Charon, MD   Outpatient Encounter Medications as of 10/29/2024  Medication Sig   aspirin 81 MG tablet Take 81 mg by mouth  daily.   busPIRone  (BUSPAR ) 10 MG tablet Take 1 tablet (10 mg total) by mouth 2 (two) times daily.   citalopram  (CELEXA ) 40 MG tablet TAKE 1 TABLET BY MOUTH EVERY DAY   diclofenac  (VOLTAREN ) 75 MG EC tablet TAKE 1 TABLET BY MOUTH TWICE A DAY   DULoxetine  (CYMBALTA ) 60 MG capsule TAKE 1 CAPSULE BY MOUTH EVERY DAY   losartan  (COZAAR ) 50 MG tablet Take 1 tablet (50 mg total) by mouth daily.   metFORMIN  (GLUCOPHAGE -XR) 500 MG 24 hr tablet TAKE 2 TABLETS BY MOUTH EVERY DAY WITH BREAKFAST   predniSONE  (DELTASONE ) 20 MG tablet 2 tabs po daily for 5 days, then 1 tab po daily for 5 days    rOPINIRole  (REQUIP ) 0.5 MG tablet Take 1 tablet (0.5 mg total) by mouth at bedtime.   simvastatin  (ZOCOR ) 40 MG tablet Take 1 tablet (40 mg total) by mouth daily.   triamcinolone  ointment (KENALOG ) 0.1 % Apply 1 application. topically 2 (two) times daily.   zolpidem  (AMBIEN ) 10 MG tablet Take 1 tablet (10 mg total) by mouth at bedtime as needed. for sleep   No facility-administered encounter medications on file as of 10/29/2024.   "

## 2024-10-29 ENCOUNTER — Ambulatory Visit: Payer: Self-pay | Admitting: Family Medicine

## 2024-10-29 ENCOUNTER — Encounter: Payer: Self-pay | Admitting: Family Medicine

## 2024-10-29 ENCOUNTER — Ambulatory Visit: Admitting: Family Medicine

## 2024-10-29 VITALS — BP 128/70 | HR 80 | Temp 98.2°F | Ht 69.5 in | Wt 260.4 lb

## 2024-10-29 DIAGNOSIS — F1010 Alcohol abuse, uncomplicated: Secondary | ICD-10-CM

## 2024-10-29 DIAGNOSIS — E119 Type 2 diabetes mellitus without complications: Secondary | ICD-10-CM

## 2024-10-29 DIAGNOSIS — Z7984 Long term (current) use of oral hypoglycemic drugs: Secondary | ICD-10-CM

## 2024-10-29 DIAGNOSIS — R7989 Other specified abnormal findings of blood chemistry: Secondary | ICD-10-CM

## 2024-10-29 DIAGNOSIS — I10 Essential (primary) hypertension: Secondary | ICD-10-CM | POA: Diagnosis not present

## 2024-10-29 DIAGNOSIS — K760 Fatty (change of) liver, not elsewhere classified: Secondary | ICD-10-CM | POA: Diagnosis not present

## 2024-10-29 DIAGNOSIS — Z7985 Long-term (current) use of injectable non-insulin antidiabetic drugs: Secondary | ICD-10-CM

## 2024-10-29 LAB — HEPATIC FUNCTION PANEL
ALT: 68 U/L — ABNORMAL HIGH (ref 3–53)
AST: 74 U/L — ABNORMAL HIGH (ref 5–37)
Albumin: 4.7 g/dL (ref 3.5–5.2)
Alkaline Phosphatase: 84 U/L (ref 39–117)
Bilirubin, Direct: 0.2 mg/dL (ref 0.1–0.3)
Total Bilirubin: 0.6 mg/dL (ref 0.2–1.2)
Total Protein: 7.6 g/dL (ref 6.0–8.3)

## 2024-10-29 LAB — POCT GLYCOSYLATED HEMOGLOBIN (HGB A1C): Hemoglobin A1C: 6.6 % — AB (ref 4.0–5.6)

## 2024-10-29 MED ORDER — OZEMPIC (0.25 OR 0.5 MG/DOSE) 2 MG/1.5ML ~~LOC~~ SOPN: 0.2500 mg | PEN_INJECTOR | SUBCUTANEOUS | 0 refills | Status: DC

## 2024-10-29 MED ORDER — METFORMIN HCL ER 500 MG PO TB24: 1000.0000 mg | ORAL_TABLET | Freq: Every day | ORAL | 1 refills | Status: AC

## 2024-10-29 MED ORDER — SEMAGLUTIDE (1 MG/DOSE) 4 MG/3ML ~~LOC~~ SOPN: 1.0000 mg | PEN_INJECTOR | SUBCUTANEOUS | 3 refills | Status: DC

## 2024-10-29 MED ORDER — OZEMPIC (0.25 OR 0.5 MG/DOSE) 2 MG/3ML ~~LOC~~ SOPN: 0.5000 mg | PEN_INJECTOR | SUBCUTANEOUS | 1 refills | Status: DC

## 2024-11-03 ENCOUNTER — Other Ambulatory Visit (HOSPITAL_COMMUNITY): Payer: Self-pay

## 2024-11-03 ENCOUNTER — Telehealth: Payer: Self-pay | Admitting: *Deleted

## 2024-11-03 NOTE — Telephone Encounter (Signed)
 Copied from CRM #[id number]. Topic: Clinical - Medication Prior Auth >> Nov 03, 2024  2:23 PM Paige D wrote: Reason for CRM: Pt is calling in regards to the Ozempic  or Wegovy  that he was suppose to get prescribed he is calling on status of prior auth. Please give pt call for status update

## 2024-11-04 ENCOUNTER — Other Ambulatory Visit (HOSPITAL_COMMUNITY): Payer: Self-pay

## 2024-11-04 ENCOUNTER — Telehealth: Payer: Self-pay

## 2024-11-04 NOTE — Telephone Encounter (Signed)
 Noted.  Left message for Mr.Guardia advising of this.

## 2024-11-04 NOTE — Telephone Encounter (Signed)
 Left message for Mr. Henry with information below provided by PA department.   Pharmacy Patient Advocate Encounter   Received notification from Pt Calls Messages that prior authorization for Ozempic  2 is required/requested.   Insurance verification completed.   The patient is insured through [facility] ADVANTAGE/RX ADVANCE.   Per test claim: Refill too soon. PA is not needed at this time. Medication was filled 10/29/24. Next eligible fill date is 11/19/24.

## 2024-11-04 NOTE — Telephone Encounter (Signed)
 Pharmacy Patient Advocate Encounter   Received notification from Pt Calls Messages that prior authorization for Ozempic  2 is required/requested.   Insurance verification completed.   The patient is insured through [facility] ADVANTAGE/RX ADVANCE.   Per test claim: Refill too soon. PA is not needed at this time. Medication was filled 10/29/24. Next eligible fill date is 11/19/24.

## 2024-11-05 ENCOUNTER — Other Ambulatory Visit: Payer: Self-pay | Admitting: Family Medicine

## 2024-11-06 ENCOUNTER — Other Ambulatory Visit (HOSPITAL_COMMUNITY): Payer: Self-pay

## 2024-11-12 ENCOUNTER — Telehealth: Payer: Self-pay | Admitting: *Deleted

## 2024-11-12 ENCOUNTER — Other Ambulatory Visit (HOSPITAL_COMMUNITY): Payer: Self-pay

## 2024-11-12 ENCOUNTER — Other Ambulatory Visit (HOSPITAL_BASED_OUTPATIENT_CLINIC_OR_DEPARTMENT_OTHER): Payer: Self-pay

## 2024-11-12 NOTE — Telephone Encounter (Signed)
 Copied from CRM #[id number]. Topic: General - Other >> Nov 12, 2024  4:33 PM Wess RAMAN wrote: Reason for CRM: Shrevin from [facility] would like the diagnosis codes and most recent A1C report for prior authorization for Trulicity   Fax #: [phone] Callback #: [phone] Opt 2

## 2024-11-12 NOTE — Telephone Encounter (Signed)
 It would probably be easiest if you send in an Rx for pharmacy.  I am sure a PA will be required but that is the best way to see if it is covered and what the co pay would be.

## 2024-11-12 NOTE — Telephone Encounter (Signed)
 Copied from CRM #[id number]. Topic: Clinical - Medication Question >> Nov 12, 2024 12:06 PM Rea C wrote: Reason for CRM: Pt called back and he is wondering if he qualifies for Trulicity . Ozempic  is costing too much co-pay wise and he wants to know if Trulicity  approves for him. He would like someone's thoughts on it because he's not sure if Dr. Watt needs to start the process for that or the re-evaluation.    [phone] BENNIE) >> Nov 12, 2024 12:27 PM Mesmerise C wrote: Pt calling to add pharmacy on file to have delivered to his home stated [facility] was a pharmacy and not home delivery and to just make sure they can deliver it, patient stated he will check with the pharmacy to make sure they off home delivery for his medications

## 2024-11-12 NOTE — Telephone Encounter (Signed)
 It is impossible for me to know what his insurance will cover.  I suggest that he call them and ask what there preferred GLP-1 medication is for diabetes.  Trulicity  is a good option.

## 2024-11-13 ENCOUNTER — Other Ambulatory Visit (HOSPITAL_COMMUNITY): Payer: Self-pay

## 2024-11-13 ENCOUNTER — Other Ambulatory Visit: Payer: Self-pay

## 2024-11-13 ENCOUNTER — Encounter: Payer: Self-pay | Admitting: Pharmacy Technician

## 2024-11-13 MED ORDER — TRULICITY 3 MG/0.5ML ~~LOC~~ SOAJ: 3.0000 mg | SUBCUTANEOUS | 3 refills | Status: DC

## 2024-11-13 MED ORDER — TRULICITY 3 MG/0.5ML ~~LOC~~ SOAJ
3.0000 mg | SUBCUTANEOUS | 3 refills | Status: AC
  Filled 2024-11-13: qty 2, 28d supply, fill #0

## 2024-11-13 MED ORDER — TRULICITY 0.75 MG/0.5ML ~~LOC~~ SOAJ: 0.7500 mg | SUBCUTANEOUS | 0 refills | Status: DC

## 2024-11-13 MED ORDER — TRULICITY 1.5 MG/0.5ML ~~LOC~~ SOAJ: 1.5000 mg | SUBCUTANEOUS | 0 refills | Status: DC

## 2024-11-13 MED ORDER — TRULICITY 0.75 MG/0.5ML ~~LOC~~ SOAJ
0.7500 mg | SUBCUTANEOUS | 0 refills | Status: AC
  Filled 2024-11-13: qty 2, 28d supply, fill #0

## 2024-11-13 MED ORDER — TRULICITY 1.5 MG/0.5ML ~~LOC~~ SOAJ
1.5000 mg | SUBCUTANEOUS | 0 refills | Status: AC
  Filled 2024-11-13: qty 2, 28d supply, fill #0

## 2024-11-13 NOTE — Telephone Encounter (Signed)
 Spoke with Mr. Rorke to let him know that his insurance did approve the Trulicity  for him.  I let him know that since he already picked up the Ozempic ,  I would go ahead and take that as prescribed and once that is gone, then is can switch over to the Trulicity .  Patient states understanding and is in agreement.    PA for Trulicity  approved from 11/13/2024 through 11/13/2025.

## 2024-11-13 NOTE — Telephone Encounter (Signed)
 Left message for Mr. Sipos to return call to office.

## 2024-11-13 NOTE — Telephone Encounter (Signed)
 Can [facility] please [facility]?  I accidentally sent it in to them first, and then I sent in to [facility].

## 2024-11-13 NOTE — Telephone Encounter (Signed)
 Called and [facility]

## 2024-11-13 NOTE — Telephone Encounter (Signed)
 Copied from CRM #[id number]. Topic: Clinical - Medication Question >> Nov 13, 2024  1:52 PM Emylou G wrote: Reason for CRM: Patient called.. has the ozempic  but now prescriped trulicity .. he paid 200 out of pocket for it - he isn't sure if he is allowed to take it unable to return it.SABRA Pls call him back asap.

## 2024-11-13 NOTE — Telephone Encounter (Signed)
 Left message for Mr. Tyler Ramsey that we have changed his pharmacy to [facility].  I also let him know we went ahead and sent in a Rx for Trulicity  and submitted the PA for approval.  We are just waiting on a decision from the insurance.  I ask that he call me back with any questions.

## 2024-11-13 NOTE — Addendum Note (Signed)
 Addended by: WATT MIRZA on: 11/13/2024 10:56 AM   Modules accepted: Orders

## 2024-11-16 ENCOUNTER — Other Ambulatory Visit (HOSPITAL_COMMUNITY): Payer: Self-pay

## 2024-11-16 MED ORDER — OZEMPIC (0.25 OR 0.5 MG/DOSE) 2 MG/3ML ~~LOC~~ SOPN: 0.5000 mg | PEN_INJECTOR | SUBCUTANEOUS | 1 refills | Status: AC

## 2024-11-16 MED ORDER — OZEMPIC (1 MG/DOSE) 4 MG/3ML ~~LOC~~ SOPN: 1.0000 mg | PEN_INJECTOR | SUBCUTANEOUS | 3 refills | Status: AC

## 2024-11-17 ENCOUNTER — Other Ambulatory Visit: Payer: Self-pay

## 2024-11-17 ENCOUNTER — Other Ambulatory Visit (HOSPITAL_COMMUNITY): Payer: Self-pay

## 2024-11-19 ENCOUNTER — Other Ambulatory Visit (HOSPITAL_COMMUNITY): Payer: Self-pay

## 2024-11-25 ENCOUNTER — Telehealth: Payer: Self-pay

## 2024-11-25 NOTE — Telephone Encounter (Signed)
 Copied from CRM [id number]. Topic: Clinical - Medication Question >> Nov 24, 2024  4:16 PM Rea ORN wrote: Reason for CRM: Pt called to request Arland call him back regarding some issues with GLP rx's. Pt mentioned copay's are still high with PA's and doesn't feel like Trulicity  is as good as Ozempic .  Please call back to advise,  [phone]

## 2024-11-25 NOTE — Telephone Encounter (Signed)
 Spoke with Tyler Ramsey.  He was following up on the PA for the Trulicity .  He states the letter he has makes it sound like they may only cover one month.  I advised we received approval with start date on 11/13/2024 and end date of 11/13/2025 which means it should be covered monthly for the next year and after the end date a new PA will need to be submitted.  Patient states understanding and would like a copy of that approval letter sent to him for his records.  Sent as requested.  Nothing further is needed at this time.

## 2024-11-27 ENCOUNTER — Other Ambulatory Visit (HOSPITAL_COMMUNITY): Payer: Self-pay

## 2024-11-27 ENCOUNTER — Telehealth: Payer: Self-pay

## 2024-11-27 NOTE — Telephone Encounter (Signed)
 Pharmacy Patient Advocate Encounter   Received notification from [facility] KEY that prior authorization for Ozempic  2 is required/requested.   Insurance verification completed.   The patient is insured through [facility] ADVANTAGE/RX ADVANCE.   Per test claim: PA started via CoverMyMeds, Key: [id number]. PA cancelled due to drug not covered by plan, may be excluded from the patient's benefit.

## 2024-11-27 NOTE — Telephone Encounter (Signed)
 Patient is going to be doing Trulicity  instead.
